# Patient Record
Sex: Female | Born: 1985
Health system: Southern US, Community
[De-identification: ages and names within clinical notes are randomized; demographics above are authoritative.]

## PROBLEM LIST (undated history)

## (undated) ENCOUNTER — Inpatient Hospital Stay (HOSPITAL_COMMUNITY): Payer: Self-pay

## (undated) DIAGNOSIS — K219 Gastro-esophageal reflux disease without esophagitis: Secondary | ICD-10-CM

## (undated) DIAGNOSIS — F419 Anxiety disorder, unspecified: Secondary | ICD-10-CM

## (undated) DIAGNOSIS — D649 Anemia, unspecified: Secondary | ICD-10-CM

## (undated) DIAGNOSIS — K279 Peptic ulcer, site unspecified, unspecified as acute or chronic, without hemorrhage or perforation: Secondary | ICD-10-CM

## (undated) HISTORY — PX: APPENDECTOMY: SHX54

## (undated) HISTORY — PX: TUBAL LIGATION: SHX77

## (undated) HISTORY — PX: TONSILLECTOMY: SUR1361

## (undated) HISTORY — PX: WISDOM TOOTH EXTRACTION: SHX21

---

## 2015-11-09 ENCOUNTER — Other Ambulatory Visit: Payer: Self-pay | Admitting: Nurse Practitioner

## 2015-11-09 DIAGNOSIS — G44221 Chronic tension-type headache, intractable: Secondary | ICD-10-CM

## 2016-07-17 DIAGNOSIS — G4721 Circadian rhythm sleep disorder, delayed sleep phase type: Secondary | ICD-10-CM | POA: Diagnosis not present

## 2016-08-30 DIAGNOSIS — M9901 Segmental and somatic dysfunction of cervical region: Secondary | ICD-10-CM | POA: Diagnosis not present

## 2016-08-30 DIAGNOSIS — M791 Myalgia: Secondary | ICD-10-CM | POA: Diagnosis not present

## 2016-08-30 DIAGNOSIS — M9902 Segmental and somatic dysfunction of thoracic region: Secondary | ICD-10-CM | POA: Diagnosis not present

## 2016-10-04 ENCOUNTER — Other Ambulatory Visit: Payer: Self-pay | Admitting: Physician Assistant

## 2016-10-04 ENCOUNTER — Ambulatory Visit
Admission: RE | Admit: 2016-10-04 | Discharge: 2016-10-04 | Disposition: A | Discharge status: Home / Self Care | Payer: 59 | Source: Ambulatory Visit | Attending: Physician Assistant | Admitting: Physician Assistant

## 2016-10-04 DIAGNOSIS — M549 Dorsalgia, unspecified: Secondary | ICD-10-CM | POA: Diagnosis not present

## 2016-10-04 DIAGNOSIS — M545 Low back pain: Secondary | ICD-10-CM

## 2016-10-04 DIAGNOSIS — M546 Pain in thoracic spine: Secondary | ICD-10-CM | POA: Diagnosis not present

## 2016-12-07 DIAGNOSIS — Z0000 Encounter for general adult medical examination without abnormal findings: Secondary | ICD-10-CM | POA: Diagnosis not present

## 2017-01-08 DIAGNOSIS — D485 Neoplasm of uncertain behavior of skin: Secondary | ICD-10-CM | POA: Diagnosis not present

## 2017-01-08 DIAGNOSIS — D225 Melanocytic nevi of trunk: Secondary | ICD-10-CM | POA: Diagnosis not present

## 2017-01-08 DIAGNOSIS — D2261 Melanocytic nevi of right upper limb, including shoulder: Secondary | ICD-10-CM | POA: Diagnosis not present

## 2017-01-08 DIAGNOSIS — L700 Acne vulgaris: Secondary | ICD-10-CM | POA: Diagnosis not present

## 2017-03-12 DIAGNOSIS — J069 Acute upper respiratory infection, unspecified: Secondary | ICD-10-CM | POA: Diagnosis not present

## 2017-06-13 DIAGNOSIS — Z01419 Encounter for gynecological examination (general) (routine) without abnormal findings: Secondary | ICD-10-CM | POA: Diagnosis not present

## 2017-07-03 DIAGNOSIS — H9202 Otalgia, left ear: Secondary | ICD-10-CM | POA: Diagnosis not present

## 2017-07-04 DIAGNOSIS — H9202 Otalgia, left ear: Secondary | ICD-10-CM | POA: Diagnosis not present

## 2017-07-04 DIAGNOSIS — M26622 Arthralgia of left temporomandibular joint: Secondary | ICD-10-CM | POA: Diagnosis not present

## 2017-07-04 DIAGNOSIS — L299 Pruritus, unspecified: Secondary | ICD-10-CM | POA: Diagnosis not present

## 2017-08-07 DIAGNOSIS — D2271 Melanocytic nevi of right lower limb, including hip: Secondary | ICD-10-CM | POA: Diagnosis not present

## 2017-08-07 DIAGNOSIS — L814 Other melanin hyperpigmentation: Secondary | ICD-10-CM | POA: Diagnosis not present

## 2017-08-07 DIAGNOSIS — Z86018 Personal history of other benign neoplasm: Secondary | ICD-10-CM | POA: Diagnosis not present

## 2017-08-07 DIAGNOSIS — D225 Melanocytic nevi of trunk: Secondary | ICD-10-CM | POA: Diagnosis not present

## 2017-08-07 DIAGNOSIS — D485 Neoplasm of uncertain behavior of skin: Secondary | ICD-10-CM | POA: Diagnosis not present

## 2017-08-07 DIAGNOSIS — D2262 Melanocytic nevi of left upper limb, including shoulder: Secondary | ICD-10-CM | POA: Diagnosis not present

## 2017-08-27 DIAGNOSIS — L2489 Irritant contact dermatitis due to other agents: Secondary | ICD-10-CM | POA: Diagnosis not present

## 2017-10-08 DIAGNOSIS — Z319 Encounter for procreative management, unspecified: Secondary | ICD-10-CM | POA: Diagnosis not present

## 2017-10-22 DIAGNOSIS — Z319 Encounter for procreative management, unspecified: Secondary | ICD-10-CM | POA: Diagnosis not present

## 2017-11-19 ENCOUNTER — Emergency Department (HOSPITAL_COMMUNITY): Payer: 59

## 2017-11-19 ENCOUNTER — Other Ambulatory Visit: Payer: Self-pay

## 2017-11-19 ENCOUNTER — Emergency Department (HOSPITAL_COMMUNITY)
Admission: EM | Admit: 2017-11-19 | Discharge: 2017-11-19 | Disposition: A | Discharge status: Home / Self Care | Payer: 59 | Attending: Emergency Medicine | Admitting: Emergency Medicine

## 2017-11-19 ENCOUNTER — Encounter (HOSPITAL_COMMUNITY): Payer: Self-pay

## 2017-11-19 DIAGNOSIS — Z3A01 Less than 8 weeks gestation of pregnancy: Secondary | ICD-10-CM | POA: Insufficient documentation

## 2017-11-19 DIAGNOSIS — O9989 Other specified diseases and conditions complicating pregnancy, childbirth and the puerperium: Secondary | ICD-10-CM | POA: Insufficient documentation

## 2017-11-19 DIAGNOSIS — Z711 Person with feared health complaint in whom no diagnosis is made: Secondary | ICD-10-CM | POA: Insufficient documentation

## 2017-11-19 DIAGNOSIS — R1030 Lower abdominal pain, unspecified: Secondary | ICD-10-CM | POA: Insufficient documentation

## 2017-11-19 DIAGNOSIS — O209 Hemorrhage in early pregnancy, unspecified: Secondary | ICD-10-CM | POA: Diagnosis not present

## 2017-11-19 DIAGNOSIS — O208 Other hemorrhage in early pregnancy: Secondary | ICD-10-CM

## 2017-11-19 DIAGNOSIS — O4690 Antepartum hemorrhage, unspecified, unspecified trimester: Secondary | ICD-10-CM

## 2017-11-19 LAB — BASIC METABOLIC PANEL
Anion gap: 7 (ref 5–15)
BUN: 7 mg/dL (ref 6–20)
CO2: 22 mmol/L (ref 22–32)
Calcium: 8.9 mg/dL (ref 8.9–10.3)
Chloride: 111 mmol/L (ref 101–111)
Creatinine, Ser: 0.74 mg/dL (ref 0.44–1.00)
GFR calc Af Amer: >60 mL/min (ref >60)
GFR calc non Af Amer: >60 mL/min (ref >60)
Glucose, Bld: 95 mg/dL (ref 65–99)
Potassium: 3.6 mmol/L (ref 3.5–5.1)
Sodium: 140 mmol/L (ref 135–145)

## 2017-11-19 LAB — CBC WITH DIFFERENTIAL/PLATELET
Abs Immature Granulocytes: 0 10*3/uL (ref 0.0–0.1)
Basophils Absolute: 0.1 10*3/uL (ref 0.0–0.1)
Basophils Relative: 1 %
Eosinophils Absolute: 0.3 10*3/uL (ref 0.0–0.7)
Eosinophils Relative: 3 %
HCT: 41.3 % (ref 36.0–46.0)
Hemoglobin: 13.9 g/dL (ref 12.0–15.0)
Immature Granulocytes: 0 %
Lymphocytes Relative: 25 %
Lymphs Abs: 2.5 10*3/uL (ref 0.7–4.0)
MCH: 30.5 pg (ref 26.0–34.0)
MCHC: 33.7 g/dL (ref 30.0–36.0)
MCV: 90.8 fL (ref 78.0–100.0)
Monocytes Absolute: 0.5 10*3/uL (ref 0.1–1.0)
Monocytes Relative: 5 %
Neutro Abs: 6.5 10*3/uL (ref 1.7–7.7)
Neutrophils Relative %: 66 %
Platelets: 243 10*3/uL (ref 150–400)
RBC: 4.55 MIL/uL (ref 3.87–5.11)
RDW: 12.2 % (ref 11.5–15.5)
WBC: 9.8 10*3/uL (ref 4.0–10.5)

## 2017-11-19 LAB — ABO/RH: ABO/RH(D): A POS

## 2017-11-19 LAB — HCG, QUANTITATIVE, PREGNANCY: hCG, Beta Chain, Quant, S: 9028 m[IU]/mL — ABNORMAL HIGH (ref <5)

## 2017-11-19 NOTE — Discharge Instructions (Addendum)
Please call your OBGYN tomorrow morning to arrange a follow up appointment. I have attached a copy of the ultrasound result report for you to bring to this appointment in case they cannot see our records. You can also let them know that your HCG today was 9,028.   Return to ER for new or worsening symptoms, any additional concerns.

## 2017-11-19 NOTE — ED Provider Notes (Signed)
[facility] EMERGENCY DEPARTMENT Provider Note   CSN: [id number] Arrival date & time: 11/19/17  1730     History   Chief Complaint Chief Complaint  Patient presents with  . Vaginal Bleeding    HPI [name] is a 32 y.o. female.  The history is provided by the patient and medical records. No language interpreter was used.   [name] is a 32 y.o. female currently 5 weelks 2 days pregnant who presents to the Emergency Department complaining of acute onset of bright red vaginal bleeding just prior to arrival. Patient reports a "gush" of bright red blood.  She felt as if the bleeding had resolved, but she just went to the restroom a few minutes ago and did have spotting.  She denies any abdominal pain or back pain currently.  She does note some mild lower abdominal cramping over the last several days, but actually did not have any today.  She has been followed by a fertility clinic and has been seen on Monday and Wednesday of last week for hCG checks, but has not had an ultrasound yet.  Most recent hCG was about 5 or 6 days ago and was 586.   History reviewed. No pertinent past medical history.  There are no active problems to display for this patient.   Past Surgical History:  Procedure Laterality Date  . TONSILLECTOMY       OB History    Gravida  1   Para      Term      Preterm      AB      Living        SAB      TAB      Ectopic      Multiple      Live Births               Home Medications    Prior to Admission medications   Not on File    Family History No family history on file.  Social History Social History   Tobacco Use  . Smoking status: Never Smoker  . Smokeless tobacco: Never Used  Substance Use Topics  . Alcohol use: Not Currently  . Drug use: Not Currently     Allergies   Patient has no known allergies.   Review of Systems Review of Systems  Gastrointestinal: Positive for abdominal pain  (Resolved). Negative for nausea and vomiting.  Genitourinary: Positive for vaginal bleeding.  All other systems reviewed and are negative.    Physical Exam Updated Vital Signs BP 112/62   Pulse 96   Temp 98.3 F (36.8 C) (Oral)   Resp 18   Ht 5\' 7"  (1.702 m)   Wt 79.4 kg (175 lb)   LMP 10/13/2017   SpO2 100%   BMI 27.41 kg/m   Physical Exam  Constitutional: She is oriented to person, place, and time. She appears well-developed and well-nourished. No distress.  HENT:  Head: Normocephalic and atraumatic.  Cardiovascular: Normal rate, regular rhythm and normal heart sounds.  No murmur heard. Pulmonary/Chest: Effort normal and breath sounds normal. No respiratory distress.  Abdominal: Soft. She exhibits no distension.  No abdominal tenderness.   Genitourinary:  Genitourinary Comments: Chaperone present for exam. Very small clot within vaginal vault.  Neurological: She is alert and oriented to person, place, and time.  Skin: Skin is warm and dry.  Nursing note and vitals reviewed.    ED Treatments / Results  Labs (  all labs ordered are listed, but only abnormal results are displayed) Labs Reviewed  HCG, QUANTITATIVE, PREGNANCY - Abnormal; Notable for the following components:      Result Value   hCG, Beta Chain, Quant, S 9,028 (*)    All other components within normal limits  CBC WITH DIFFERENTIAL/PLATELET  BASIC METABOLIC PANEL  ABO/RH    EKG None  Radiology US Ob Less Than 14 Weeks With Ob Transvaginal  Result Date: 11/19/2017 CLINICAL DATA:  Vaginal bleeding EXAM: OBSTETRIC <14 WK [place] AND TRANSVAGINAL OB US TECHNIQUE: Both transabdominal and transvaginal ultrasound examinations were performed for complete evaluation of the gestation as well as the maternal uterus, adnexal regions, and pelvic cul-de-sac. Transvaginal technique was performed to assess early pregnancy. COMPARISON:  None. FINDINGS: Intrauterine gestational sac: Visualized Yolk sac:  Not visualized  Embryo:  Not visualized Cardiac Activity: Not visualized MSD: 7 mm   5 w   3 d Subchorionic hemorrhage:  None visualized. Maternal uterus/adnexae: Uterus measures 9.9 x 4.2 x 5.2 cm. No intrauterine mass. Cervical os is closed. Right ovary measures 2.4 x 1.9 x 2.5 cm. Left ovary measures 3.8 x 2.4 x 2.8 cm. Corpus luteum on the left measures 2.7 x 1.8 x 2.0 cm. No free pelvic fluid. IMPRESSION: Probable early intrauterine gestational sac, but no yolk sac, fetal pole, or cardiac activity yet visualized. Recommend follow-up quantitative B-HCG levels and follow-up US in 14 days to assess viability. This recommendation follows SRU consensus guidelines: Diagnostic Criteria for Nonviable Pregnancy Early in the First Trimester. [employer] Med 2013; 431:5400-86. based on gestational sac size, estimated gestational age is 5+ weeks. Study otherwise unremarkable. Electronically Signed   By: Lowella Grip III M.D.   On: 11/19/2017 20:51    Procedures Procedures (including critical care time)  Medications Ordered in ED Medications - No data to display   Initial Impression / Assessment and Plan / ED Course  I have reviewed the triage vital signs and the nursing notes.  Pertinent labs & imaging results that were available during my care of the patient were reviewed by me and considered in my medical decision making (see chart for details).    [name] is a 32 y.o. female who presents to ED for vaginal bleeding which began acutely today. [redacted] weeks pregnant. Most recent hcg in office per patient was 586. Today is 9028. She did have small clot on pelvic exam. A+ - does not need rhogam. Ultrasound obtained showing probable early intrauterine gestational sac, but no yolk sac, fetal pole or cardiac activity.  No subchorionic hemorrhage noted.  Given history and ultrasound findings, miscarriage most likely.  Discussed this with patient.  Discussed importance of follow-up with her OB/GYN for repeat hCG and ultrasound.   She understands and agrees with the plan as dictated above.  We also discussed reasons to return to the emergency department.  All questions were answered.   Final Clinical Impressions(s) / ED Diagnoses   Final diagnoses:  Vaginal bleeding in pregnancy  Concern about miscarriage without diagnosis    ED Discharge Orders    None       [name], Ozella Almond, PA-C 11/19/17 2214    Duffy Bruce, MD 11/20/17 1022

## 2017-11-19 NOTE — ED Provider Notes (Signed)
Patient placed in Quick Look pathway, seen and evaluated   Chief Complaint: Vaginal bleeding  HPI: 32-year-old female presents today with complaints of vaginal bleeding.  Patient notes a gush of vaginal blood today, no active bleeding presently.  She notes she has had some minor bilateral lower abdominal cramping.  She notes she is approximately [redacted] weeks along.  Patient has not had an ultrasound.   ROS: Vaginal bleeding (one)  Physical Exam:   Gen: No distress  Neuro: Awake and Alert  Skin: Warm    Focused Exam:    Initiation of care has begun. The patient has been counseled on the process, plan, and necessity for staying for the completion/evaluation, and the remainder of the medical screening examination   Francee Gentile 11/19/17 1757    Noemi Chapel, MD 11/23/17 [phone]

## 2017-11-19 NOTE — ED Triage Notes (Signed)
Pt states that she is 5 weeks and 2 days pregnant, closely followed by fertility specialist. States that today at 5:05 pm she had a large gush of blood that has since stopped. Pt reports some mild cramping for the past week.

## 2017-11-19 NOTE — ED Notes (Signed)
Pt transported to Ultrasound.  

## 2017-11-28 DIAGNOSIS — Z3200 Encounter for pregnancy test, result unknown: Secondary | ICD-10-CM | POA: Diagnosis not present

## 2017-12-12 DIAGNOSIS — O0900 Supervision of pregnancy with history of infertility, unspecified trimester: Secondary | ICD-10-CM | POA: Diagnosis not present

## 2017-12-26 DIAGNOSIS — O26849 Uterine size-date discrepancy, unspecified trimester: Secondary | ICD-10-CM | POA: Diagnosis not present

## 2017-12-27 ENCOUNTER — Encounter (HOSPITAL_COMMUNITY): Payer: Self-pay | Admitting: *Deleted

## 2017-12-27 ENCOUNTER — Inpatient Hospital Stay (HOSPITAL_COMMUNITY)
Admission: AD | Admit: 2017-12-27 | Discharge: 2017-12-27 | Disposition: A | Discharge status: Home / Self Care | Payer: 59 | Source: Ambulatory Visit | Attending: Obstetrics and Gynecology | Admitting: Obstetrics and Gynecology

## 2017-12-27 DIAGNOSIS — O99611 Diseases of the digestive system complicating pregnancy, first trimester: Secondary | ICD-10-CM | POA: Insufficient documentation

## 2017-12-27 DIAGNOSIS — Z3A10 10 weeks gestation of pregnancy: Secondary | ICD-10-CM | POA: Insufficient documentation

## 2017-12-27 DIAGNOSIS — O219 Vomiting of pregnancy, unspecified: Secondary | ICD-10-CM

## 2017-12-27 DIAGNOSIS — Z79899 Other long term (current) drug therapy: Secondary | ICD-10-CM | POA: Diagnosis not present

## 2017-12-27 DIAGNOSIS — O99281 Endocrine, nutritional and metabolic diseases complicating pregnancy, first trimester: Secondary | ICD-10-CM

## 2017-12-27 DIAGNOSIS — Z9889 Other specified postprocedural states: Secondary | ICD-10-CM | POA: Diagnosis not present

## 2017-12-27 DIAGNOSIS — Z9089 Acquired absence of other organs: Secondary | ICD-10-CM | POA: Insufficient documentation

## 2017-12-27 DIAGNOSIS — E639 Nutritional deficiency, unspecified: Secondary | ICD-10-CM

## 2017-12-27 HISTORY — DX: Gastro-esophageal reflux disease without esophagitis: K21.9

## 2017-12-27 LAB — CBC
HCT: 40.9 % (ref 36.0–46.0)
Hemoglobin: 14.1 g/dL (ref 12.0–15.0)
MCH: 31.3 pg (ref 26.0–34.0)
MCHC: 34.5 g/dL (ref 30.0–36.0)
MCV: 90.7 fL (ref 78.0–100.0)
PLATELETS: 209 10*3/uL (ref 150–400)
RBC: 4.51 MIL/uL (ref 3.87–5.11)
RDW: 12.4 % (ref 11.5–15.5)
WBC: 10.7 10*3/uL — ABNORMAL HIGH (ref 4.0–10.5)

## 2017-12-27 LAB — URINALYSIS, ROUTINE W REFLEX MICROSCOPIC
Bilirubin Urine: NEGATIVE
GLUCOSE, UA: NEGATIVE mg/dL
KETONES UR: 80 mg/dL — AB
Leukocytes, UA: NEGATIVE
NITRITE: NEGATIVE
Protein, ur: NEGATIVE mg/dL
Specific Gravity, Urine: 1.02 (ref 1.005–1.030)
pH: 5 (ref 5.0–8.0)

## 2017-12-27 LAB — COMPREHENSIVE METABOLIC PANEL
ALT: 12 U/L (ref 0–44)
ANION GAP: 12 (ref 5–15)
AST: 36 U/L (ref 15–41)
Albumin: 3.6 g/dL (ref 3.5–5.0)
Alkaline Phosphatase: 56 U/L (ref 38–126)
BUN: 11 mg/dL (ref 6–20)
CHLORIDE: 104 mmol/L (ref 98–111)
CO2: 19 mmol/L — AB (ref 22–32)
Calcium: 9.1 mg/dL (ref 8.9–10.3)
Creatinine, Ser: 0.52 mg/dL (ref 0.44–1.00)
GFR calc Af Amer: >60 mL/min (ref >60)
GFR calc non Af Amer: >60 mL/min (ref >60)
Glucose, Bld: 80 mg/dL (ref 70–99)
POTASSIUM: 5.3 mmol/L — AB (ref 3.5–5.1)
SODIUM: 135 mmol/L (ref 135–145)
Total Bilirubin: 1.5 mg/dL — ABNORMAL HIGH (ref 0.3–1.2)
Total Protein: 7.3 g/dL (ref 6.5–8.1)

## 2017-12-27 MED ORDER — PROMETHAZINE HCL 25 MG/ML IJ SOLN
25.0000 mg | Freq: Once | INTRAVENOUS | Status: DC
  Filled 2017-12-27: qty 1

## 2017-12-27 MED ORDER — METOCLOPRAMIDE HCL 10 MG PO TABS: 10.0000 mg | ORAL_TABLET | Freq: Four times a day (QID) | ORAL | 0 refills | Status: DC

## 2017-12-27 MED ORDER — ONDANSETRON 4 MG PO TBDP
4.0000 mg | ORAL_TABLET | Freq: Once | ORAL | Status: AC
  Administered 2017-12-27: 4 mg via ORAL
  Filled 2017-12-27: qty 1

## 2017-12-27 MED ORDER — M.V.I. ADULT IV INJ
Freq: Once | INTRAVENOUS | Status: AC
  Administered 2017-12-27: 15:00:00 via INTRAVENOUS
  Filled 2017-12-27: qty 10

## 2017-12-27 MED ORDER — PROMETHAZINE HCL 25 MG/ML IJ SOLN
12.5000 mg | Freq: Once | INTRAMUSCULAR | Status: AC
  Administered 2017-12-27: 12.5 mg via INTRAVENOUS
  Filled 2017-12-27: qty 1

## 2017-12-27 MED ORDER — LACTATED RINGERS IV SOLN
INTRAVENOUS | Status: DC
  Administered 2017-12-27: 16:00:00 via INTRAVENOUS

## 2017-12-27 NOTE — Discharge Instructions (Signed)

## 2017-12-27 NOTE — MAU Provider Note (Signed)
History     CSN: [id number]  Arrival date and time: 12/27/17 1231   First Provider Initiated Contact with Patient 12/27/17 1336      No chief complaint on file.  HPI [name] is a G1P0 at [redacted]w[redacted]d who resents to MAU with chief complaint of unrelieved nausea and vomiting, unable to tolerate PO intake whatsoever.  Reports nausea has been a mild issue in her pregnancy that worsened significantly starting yesterday at lunchtime. She believes she has vomited more than ten times since lunch time yesterday.  Lying in right lateral on MAU stretcher, unable to speak in full sentences due to nausea.Denies vaginal bleeding, leaking of fluid, decreased fetal movement, fever, falls, or recent illness.     OB History    Gravida  1   Para      Term      Preterm      AB      Living        SAB      TAB      Ectopic      Multiple      Live Births              Past Medical History:  Diagnosis Date  . GERD (gastroesophageal reflux disease)     Past Surgical History:  Procedure Laterality Date  . TONSILLECTOMY    . WISDOM TOOTH EXTRACTION      History reviewed. No pertinent family history.  Social History   Tobacco Use  . Smoking status: Never Smoker  . Smokeless tobacco: Never Used  Substance Use Topics  . Alcohol use: Not Currently    Comment: prior to preg  . Drug use: Never    Allergies: No Known Allergies  Medications Prior to Admission  Medication Sig Dispense Refill Last Dose  . doxylamine, Sleep, (UNISOM) 25 MG tablet Take 25 mg by mouth at bedtime as needed.   Past Week at Unknown time  . prenatal vitamin w/FE, FA (PRENATAL 1 + 1) 27-1 MG TABS tablet Take 1 tablet by mouth daily at 12 noon.   Past Week at Unknown time  . pyridOXINE (VITAMIN B-6) 100 MG tablet Take 100 mg by mouth daily.   12/27/2017 at Unknown time    Review of Systems  Constitutional: Positive for fatigue. Negative for fever.  Gastrointestinal: Positive for abdominal pain.        Patient states her stomach feels sore from multiple vomiting episodes  Genitourinary: Negative for difficulty urinating, dysuria, pelvic pain, vaginal bleeding, vaginal discharge and vaginal pain.  Neurological: Positive for dizziness, light-headedness and headaches. Negative for syncope and weakness.   Physical Exam   Blood pressure (!) 99/56, pulse (!) 123, temperature 98.3 F (36.8 C), temperature source Oral, resp. rate 16, height 5\' 7"  (1.702 m), last menstrual period 10/13/2017.  Physical Exam  Nursing note and vitals reviewed. Constitutional: She is oriented to person, place, and time. She appears well-developed and well-nourished.  Cardiovascular: Normal rate, regular rhythm, normal heart sounds and intact distal pulses.  Respiratory: Effort normal and breath sounds normal.  GI: Soft. Bowel sounds are normal.  Musculoskeletal: Normal range of motion.  Neurological: She is alert and oriented to person, place, and time. She has normal reflexes.  Skin: Skin is warm and dry.  Psychiatric: She has a normal mood and affect. Her behavior is normal. Judgment and thought content normal.    MAU Course  Procedures  MDM New OB and viability scan yesterday at [facility]  OB/GYN Nausea/vomiting in early pregnancy Responding well to IV vitamins/D5LR No vomiting episodes in MAU Reviewed Zofran vs. Reglan vs Phenergan for outpatient management Patient and FOB open to discussion about meal planning to minimize nausea/vomiting episodes  Patient Vitals for the past 24 hrs:  BP Temp Temp src Pulse Resp SpO2 Height  12/27/17 1700 108/61 98.6 F (37 C) Oral 74 - 100 % -  12/27/17 1330 (!) 99/56 - - (!) 123 - - -  12/27/17 1327 127/68 - - 87 - - -  12/27/17 1326 108/62 98.3 F (36.8 C) Oral 91 16 - 5\' 7"  (1.702 m)     Results for orders placed or performed during the hospital encounter of 12/27/17 (from the past 24 hour(s))  Urinalysis, Routine w reflex microscopic     Status: Abnormal    Collection Time: 12/27/17  1:05 PM  Result Value Ref Range   Color, Urine YELLOW YELLOW   APPearance HAZY (A) CLEAR   Specific Gravity, Urine 1.020 1.005 - 1.030   pH 5.0 5.0 - 8.0   Glucose, UA NEGATIVE NEGATIVE mg/dL   Hgb urine dipstick MODERATE (A) NEGATIVE   Bilirubin Urine NEGATIVE NEGATIVE   Ketones, ur 80 (A) NEGATIVE mg/dL   Protein, ur NEGATIVE NEGATIVE mg/dL   Nitrite NEGATIVE NEGATIVE   Leukocytes, UA NEGATIVE NEGATIVE   RBC / HPF 0-5 0 - 5 RBC/hpf   WBC, UA 0-5 0 - 5 WBC/hpf   Bacteria, UA RARE (A) NONE SEEN   Squamous Epithelial / LPF 0-5 0 - 5   Mucus PRESENT   CBC     Status: Abnormal   Collection Time: 12/27/17  2:32 PM  Result Value Ref Range   WBC 10.7 (H) 4.0 - 10.5 K/uL   RBC 4.51 3.87 - 5.11 MIL/uL   Hemoglobin 14.1 12.0 - 15.0 g/dL   HCT 40.9 36.0 - 46.0 %   MCV 90.7 78.0 - 100.0 fL   MCH 31.3 26.0 - 34.0 pg   MCHC 34.5 30.0 - 36.0 g/dL   RDW 12.4 11.5 - 15.5 %   Platelets 209 150 - 400 K/uL  Comprehensive metabolic panel     Status: Abnormal   Collection Time: 12/27/17  2:32 PM  Result Value Ref Range   Sodium 135 135 - 145 mmol/L   Potassium 5.3 (H) 3.5 - 5.1 mmol/L   Chloride 104 98 - 111 mmol/L   CO2 19 (L) 22 - 32 mmol/L   Glucose, Bld 80 70 - 99 mg/dL   BUN 11 6 - 20 mg/dL   Creatinine, Ser 0.52 0.44 - 1.00 mg/dL   Calcium 9.1 8.9 - 10.3 mg/dL   Total Protein 7.3 6.5 - 8.1 g/dL   Albumin 3.6 3.5 - 5.0 g/dL   AST 36 15 - 41 U/L   ALT 12 0 - 44 U/L   Alkaline Phosphatase 56 38 - 126 U/L   Total Bilirubin 1.5 (H) 0.3 - 1.2 mg/dL   GFR calc non Af Amer >60 >60 mL/min   GFR calc Af Amer >60 >60 mL/min   Anion gap 12 5 - 15    Meds ordered this encounter  Medications  . ondansetron (ZOFRAN-ODT) disintegrating tablet 4 mg  . multivitamins adult (MVI -12) 10 mL in dextrose 5% lactated ringers 1,000 mL infusion  . lactated ringers infusion  . DISCONTD: promethazine (PHENERGAN) 25 mg in lactated ringers 1,000 mL infusion  . promethazine  (PHENERGAN) injection 12.5 mg  . metoCLOPramide (REGLAN) 10 MG tablet  Sig: Take 1 tablet (10 mg total) by mouth every 6 (six) hours.    Dispense:  30 tablet    Refill:  0    Order Specific Question:   Supervising Provider    Answer:   Donnamae Jude [[id number]]   Discussed with the patient the risk of Zofran use in the first trimester of pregnancy. Risks of use in the first trimester include birth defects in babies, specifically cleft lip/palate.  Pt states understanding and plans to use Zofran sparingly.    Feeling much better after IV fluids and Zofran ODT  Assessment and Plan  --Nausea and vomiting in pregnancy --Desires antiemetic alternative to Doxylamine/B6 --Advised Reglan 10 mg PO am and Benadryl or Tylenol PM at bedtime --Reviewed bland diet and advised to journal trigger foods/smells to minimize episodes --Patient to follow up outpatient with [facility] Provider  Presentation, clinical findings, and plan discussed with Dr. Rogue Bussing. Stable for discharge home.  Darlina Rumpf, CNM 12/27/2017, 5:25 PM

## 2017-12-27 NOTE — MAU Note (Signed)
Pt had first appt at [facility] yesterday. Started vomiting after the appt and vomited at least 6 x yesterday and 5X today. Unable to keep crackers or sips of pedialtye down. Denies any diarrhea.

## 2018-01-13 DIAGNOSIS — Z3480 Encounter for supervision of other normal pregnancy, unspecified trimester: Secondary | ICD-10-CM | POA: Diagnosis not present

## 2018-01-13 LAB — OB RESULTS CONSOLE HEPATITIS B SURFACE ANTIGEN: HEP B S AG: NEGATIVE

## 2018-01-13 LAB — OB RESULTS CONSOLE ABO/RH: RH TYPE: POSITIVE

## 2018-01-13 LAB — OB RESULTS CONSOLE HIV ANTIBODY (ROUTINE TESTING): HIV: NONREACTIVE

## 2018-01-13 LAB — OB RESULTS CONSOLE GC/CHLAMYDIA
CHLAMYDIA, DNA PROBE: NEGATIVE
Gonorrhea: NEGATIVE

## 2018-01-13 LAB — OB RESULTS CONSOLE RUBELLA ANTIBODY, IGM: Rubella: NON-IMMUNE/NOT IMMUNE

## 2018-01-13 LAB — OB RESULTS CONSOLE ANTIBODY SCREEN: Antibody Screen: NEGATIVE

## 2018-01-13 LAB — OB RESULTS CONSOLE RPR: RPR: NONREACTIVE

## 2018-02-20 DIAGNOSIS — Z369 Encounter for antenatal screening, unspecified: Secondary | ICD-10-CM | POA: Diagnosis not present

## 2018-02-27 DIAGNOSIS — L910 Hypertrophic scar: Secondary | ICD-10-CM | POA: Diagnosis not present

## 2018-03-04 DIAGNOSIS — M542 Cervicalgia: Secondary | ICD-10-CM | POA: Diagnosis not present

## 2018-03-04 DIAGNOSIS — M2560 Stiffness of unspecified joint, not elsewhere classified: Secondary | ICD-10-CM | POA: Diagnosis not present

## 2018-03-04 DIAGNOSIS — R293 Abnormal posture: Secondary | ICD-10-CM | POA: Diagnosis not present

## 2018-04-18 DIAGNOSIS — Z3480 Encounter for supervision of other normal pregnancy, unspecified trimester: Secondary | ICD-10-CM | POA: Diagnosis not present

## 2018-04-22 IMAGING — CR DG LUMBAR SPINE 2-3V
3 series · 3 of 3 positions shown · non-contrast
Comparison: None in PACs

CLINICAL DATA: Chronic low back pain with a catching sensation for
several months. No known injury

EXAM:
LUMBAR SPINE - 2-3 VIEW

[w lumbar spine ap]
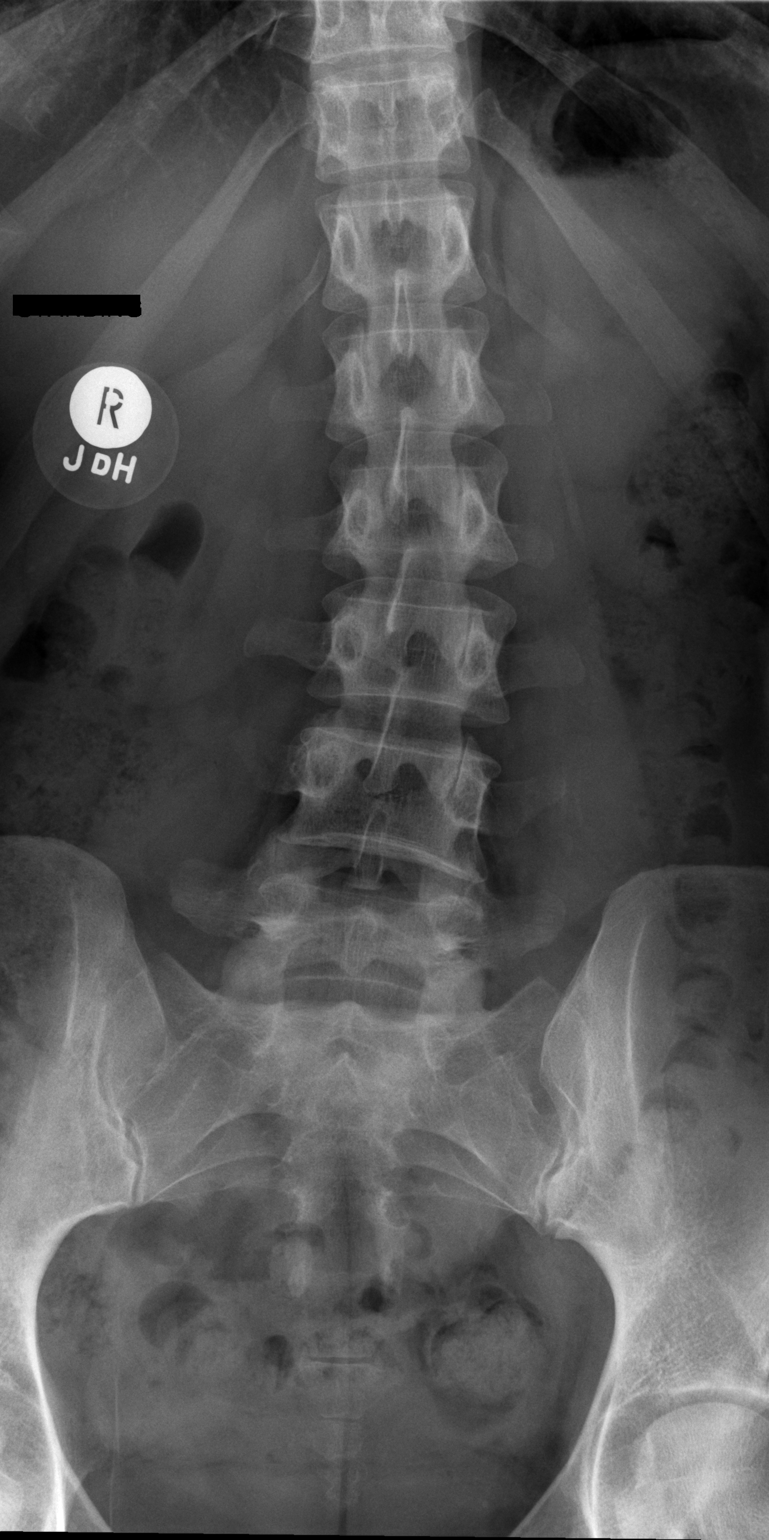

[w lumbar spine lat]
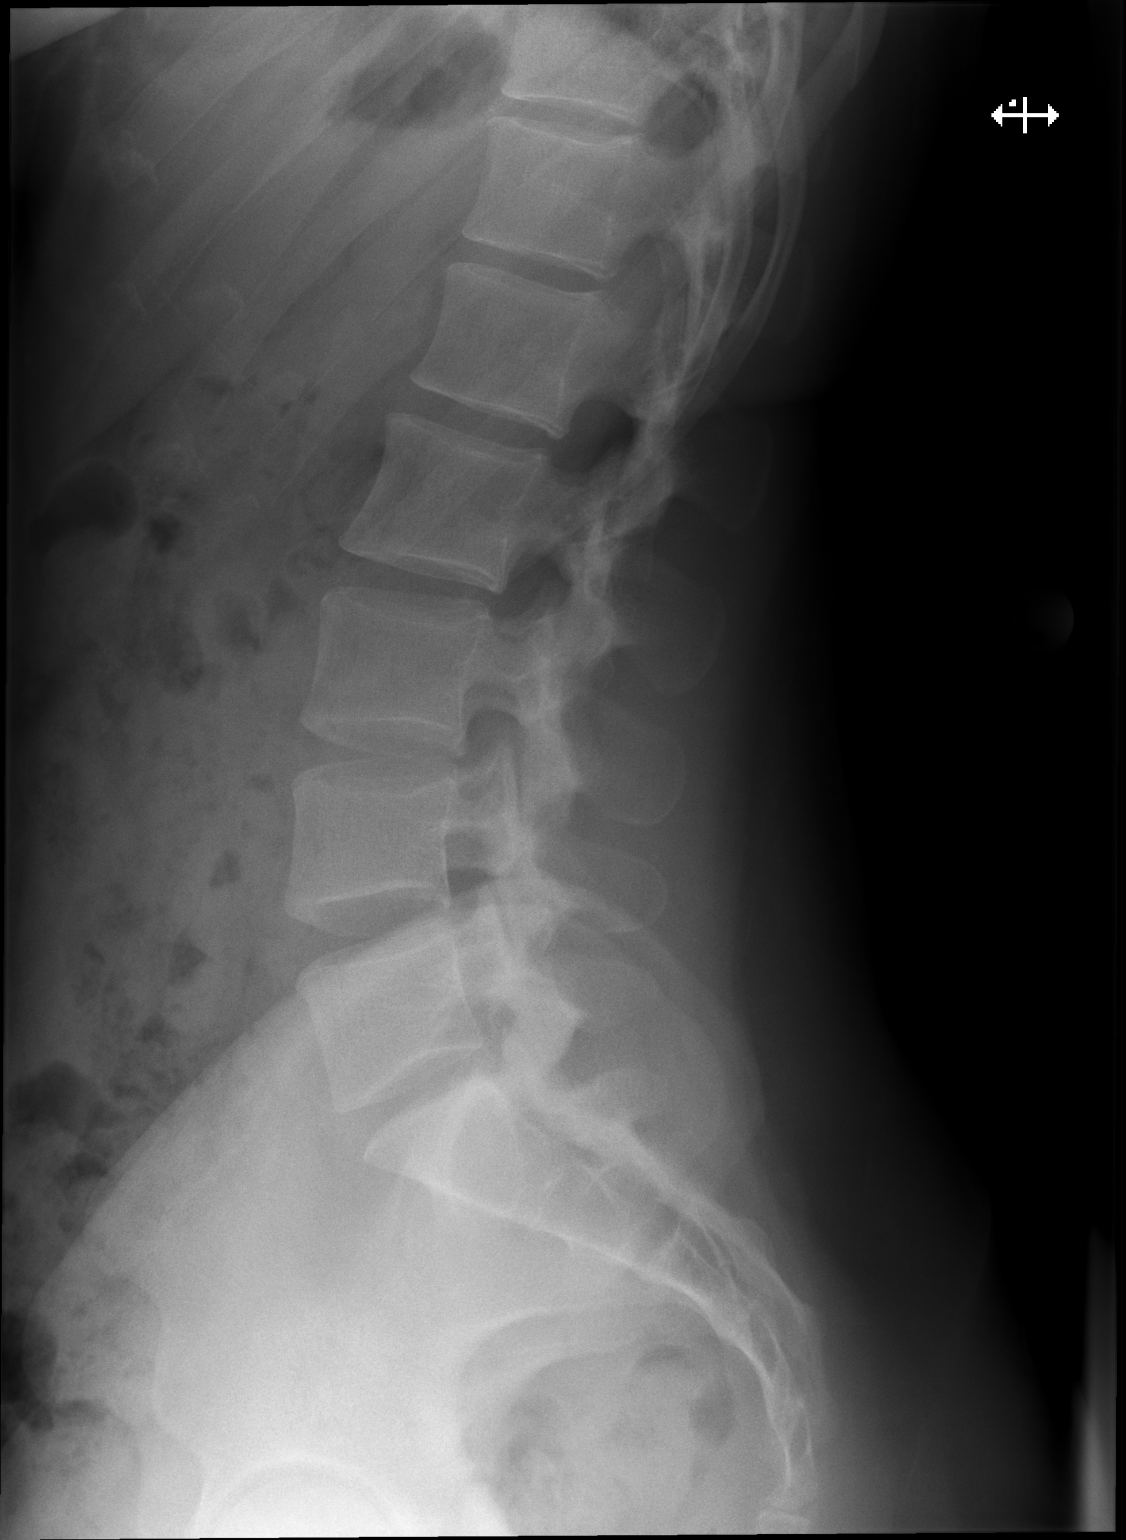

[w lumbar l-5 s-1 spot]
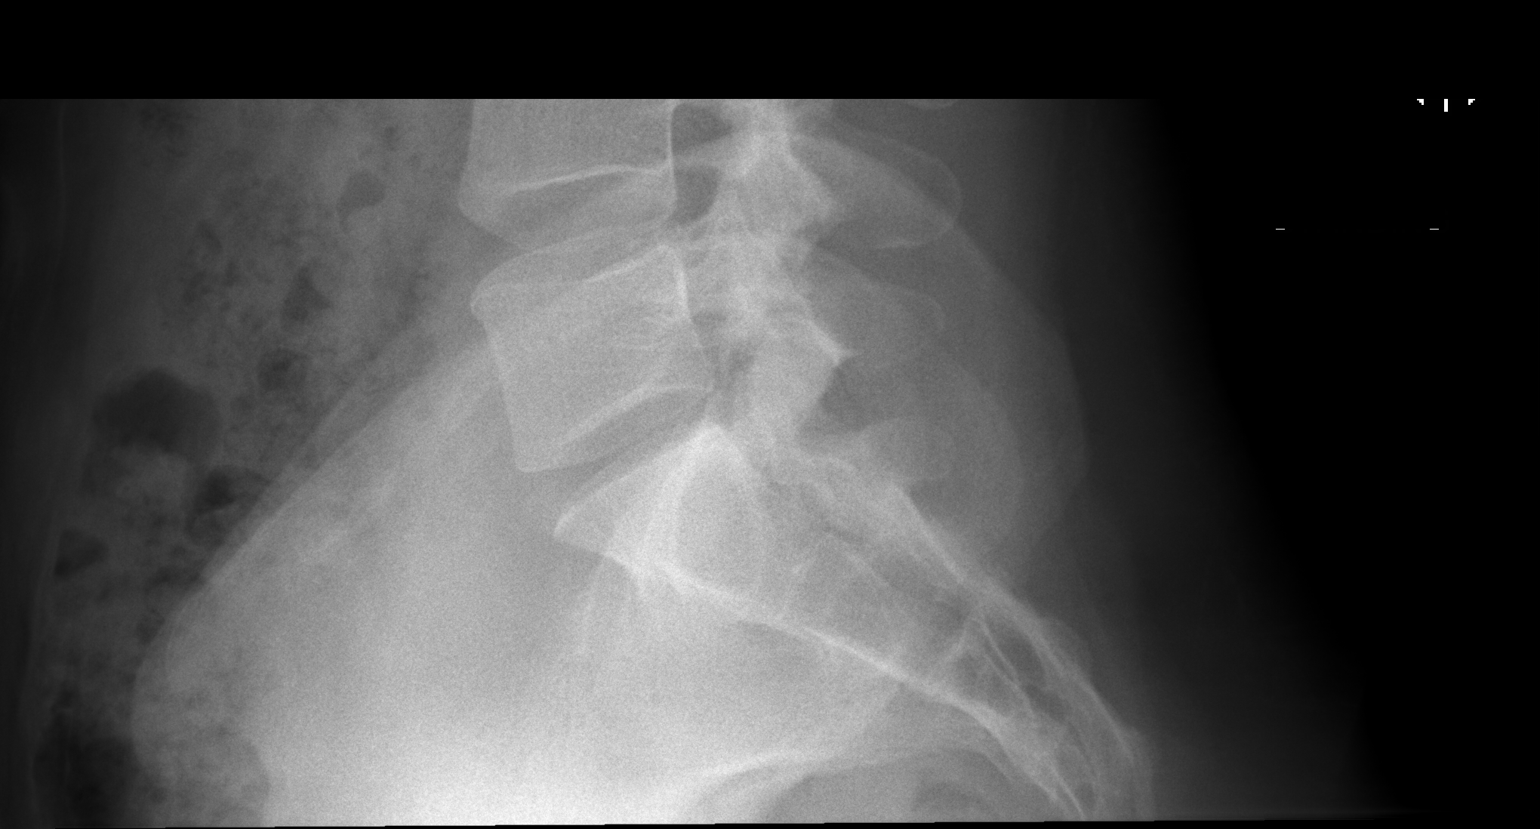

[3 of 3 positions shown; findings below may reference images not displayed]

FINDINGS: There is mild levocurvature centered at L2-3. The pedicles and
transverse processes are intact. The vertebral bodies are preserved
in height. There is no spondylolisthesis. The disc space heights are
well maintained. The observed portions of the sacrum are normal.
IMPRESSION: Gentle levocurvature centered at L2-3. No compression fracture,
significant disc space narrowing, or spondylolisthesis.

## 2018-05-01 DIAGNOSIS — O99810 Abnormal glucose complicating pregnancy: Secondary | ICD-10-CM | POA: Diagnosis not present

## 2018-05-13 DIAGNOSIS — Q270 Congenital absence and hypoplasia of umbilical artery: Secondary | ICD-10-CM | POA: Diagnosis not present

## 2018-05-13 DIAGNOSIS — Z23 Encounter for immunization: Secondary | ICD-10-CM | POA: Diagnosis not present

## 2018-06-09 ENCOUNTER — Inpatient Hospital Stay (HOSPITAL_COMMUNITY)
Admission: AD | Admit: 2018-06-09 | Discharge: 2018-06-09 | Disposition: A | Discharge status: Home / Self Care | Payer: 59 | Attending: Obstetrics and Gynecology | Admitting: Obstetrics and Gynecology

## 2018-06-09 ENCOUNTER — Inpatient Hospital Stay (HOSPITAL_BASED_OUTPATIENT_CLINIC_OR_DEPARTMENT_OTHER): Payer: 59

## 2018-06-09 ENCOUNTER — Encounter (HOSPITAL_COMMUNITY): Payer: Self-pay

## 2018-06-09 DIAGNOSIS — Z3A34 34 weeks gestation of pregnancy: Secondary | ICD-10-CM | POA: Insufficient documentation

## 2018-06-09 DIAGNOSIS — O9A213 Injury, poisoning and certain other consequences of external causes complicating pregnancy, third trimester: Secondary | ICD-10-CM

## 2018-06-09 DIAGNOSIS — O26893 Other specified pregnancy related conditions, third trimester: Secondary | ICD-10-CM | POA: Insufficient documentation

## 2018-06-09 DIAGNOSIS — R1030 Lower abdominal pain, unspecified: Secondary | ICD-10-CM | POA: Insufficient documentation

## 2018-06-09 LAB — URINALYSIS, ROUTINE W REFLEX MICROSCOPIC
Bilirubin Urine: NEGATIVE
GLUCOSE, UA: NEGATIVE mg/dL
Hgb urine dipstick: NEGATIVE
Ketones, ur: NEGATIVE mg/dL
Leukocytes, UA: NEGATIVE
NITRITE: NEGATIVE
PROTEIN: NEGATIVE mg/dL
Specific Gravity, Urine: 1.003 — ABNORMAL LOW (ref 1.005–1.030)
pH: 7 (ref 5.0–8.0)

## 2018-06-09 MED ORDER — LACTATED RINGERS IV SOLN: INTRAVENOUS | Status: DC
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
## 2018-06-23 DIAGNOSIS — Z3480 Encounter for supervision of other normal pregnancy, unspecified trimester: Secondary | ICD-10-CM | POA: Diagnosis not present

## 2018-06-23 DIAGNOSIS — Q270 Congenital absence and hypoplasia of umbilical artery: Secondary | ICD-10-CM | POA: Diagnosis not present

## 2018-06-26 LAB — OB RESULTS CONSOLE GBS: STREP GROUP B AG: NEGATIVE

## 2018-07-08 DIAGNOSIS — O320XX9 Maternal care for unstable lie, other fetus: Secondary | ICD-10-CM | POA: Diagnosis not present

## 2018-07-15 ENCOUNTER — Other Ambulatory Visit: Payer: Self-pay | Admitting: Obstetrics and Gynecology

## 2018-07-15 DIAGNOSIS — O368131 Decreased fetal movements, third trimester, fetus 1: Secondary | ICD-10-CM | POA: Diagnosis not present

## 2018-07-16 ENCOUNTER — Telehealth (HOSPITAL_COMMUNITY): Payer: Self-pay | Admitting: *Deleted

## 2018-07-16 NOTE — Telephone Encounter (Signed)
Preadmission screen  

## 2018-07-17 ENCOUNTER — Telehealth (HOSPITAL_COMMUNITY): Payer: Self-pay | Admitting: *Deleted

## 2018-07-17 NOTE — Telephone Encounter (Signed)
Preadmission screen  

## 2018-07-21 DIAGNOSIS — O480 Post-term pregnancy: Secondary | ICD-10-CM | POA: Diagnosis not present

## 2018-07-23 ENCOUNTER — Telehealth (HOSPITAL_COMMUNITY): Payer: Self-pay | Admitting: *Deleted

## 2018-07-23 ENCOUNTER — Encounter (HOSPITAL_COMMUNITY): Payer: Self-pay | Admitting: *Deleted

## 2018-07-23 NOTE — Telephone Encounter (Signed)
Preadmission screen  

## 2018-07-25 ENCOUNTER — Inpatient Hospital Stay (HOSPITAL_COMMUNITY): Payer: 59 | Admitting: Anesthesiology

## 2018-07-25 ENCOUNTER — Encounter (HOSPITAL_COMMUNITY): Payer: Self-pay

## 2018-07-25 ENCOUNTER — Inpatient Hospital Stay (HOSPITAL_COMMUNITY)
Admission: RE | Admit: 2018-07-25 | Discharge: 2018-07-28 | DRG: 788 | Disposition: A | Discharge status: Home / Self Care | Payer: 59 | Attending: Obstetrics and Gynecology | Admitting: Obstetrics and Gynecology

## 2018-07-25 ENCOUNTER — Encounter (HOSPITAL_COMMUNITY)
Admission: RE | Disposition: A | Discharge status: Home / Self Care | Payer: Self-pay | Source: Home / Self Care | Attending: Obstetrics and Gynecology

## 2018-07-25 ENCOUNTER — Other Ambulatory Visit: Payer: Self-pay

## 2018-07-25 VITALS — BP 127/84 | HR 73 | Temp 97.6°F | Resp 16 | Ht 67.0 in | Wt 219.0 lb

## 2018-07-25 DIAGNOSIS — O9902 Anemia complicating childbirth: Secondary | ICD-10-CM | POA: Diagnosis present

## 2018-07-25 DIAGNOSIS — D649 Anemia, unspecified: Secondary | ICD-10-CM | POA: Diagnosis present

## 2018-07-25 DIAGNOSIS — Z3A00 Weeks of gestation of pregnancy not specified: Secondary | ICD-10-CM | POA: Diagnosis not present

## 2018-07-25 DIAGNOSIS — Z3490 Encounter for supervision of normal pregnancy, unspecified, unspecified trimester: Secondary | ICD-10-CM

## 2018-07-25 DIAGNOSIS — O480 Post-term pregnancy: Secondary | ICD-10-CM | POA: Diagnosis not present

## 2018-07-25 DIAGNOSIS — Z3A40 40 weeks gestation of pregnancy: Secondary | ICD-10-CM | POA: Diagnosis not present

## 2018-07-25 DIAGNOSIS — Z3A41 41 weeks gestation of pregnancy: Secondary | ICD-10-CM

## 2018-07-25 HISTORY — DX: Encounter for supervision of normal pregnancy, unspecified, unspecified trimester: Z34.90

## 2018-07-25 LAB — CBC
HEMATOCRIT: 31.2 % — AB (ref 36.0–46.0)
Hemoglobin: 10 g/dL — ABNORMAL LOW (ref 12.0–15.0)
MCH: 27 pg (ref 26.0–34.0)
MCHC: 32.1 g/dL (ref 30.0–36.0)
MCV: 84.3 fL (ref 80.0–100.0)
Platelets: 144 10*3/uL — ABNORMAL LOW (ref 150–400)
RBC: 3.7 MIL/uL — ABNORMAL LOW (ref 3.87–5.11)
RDW: 15.9 % — ABNORMAL HIGH (ref 11.5–15.5)
WBC: 8.9 10*3/uL (ref 4.0–10.5)

## 2018-07-25 LAB — TYPE AND SCREEN
ABO/RH(D): A POS
Antibody Screen: NEGATIVE

## 2018-07-25 LAB — RPR: RPR Ser Ql: NONREACTIVE

## 2018-07-25 LAB — ABO/RH: ABO/RH(D): A POS

## 2018-07-25 SURGERY — Surgical Case
Anesthesia: Epidural

## 2018-07-25 MED ORDER — PHENYLEPHRINE HCL 10 MG/ML IJ SOLN
INTRAMUSCULAR | Status: DC | PRN
  Administered 2018-07-25 (×3): 80 ug via INTRAVENOUS

## 2018-07-25 MED ORDER — TERBUTALINE SULFATE 1 MG/ML IJ SOLN
0.2500 mg | Freq: Once | INTRAMUSCULAR | Status: AC | PRN
  Administered 2018-07-25: 0.25 mg via SUBCUTANEOUS
  Filled 2018-07-25: qty 1

## 2018-07-25 MED ORDER — DIPHENHYDRAMINE HCL 25 MG PO CAPS: 25.0000 mg | ORAL_CAPSULE | Freq: Four times a day (QID) | ORAL | Status: DC | PRN

## 2018-07-25 MED ORDER — DEXAMETHASONE SODIUM PHOSPHATE 4 MG/ML IJ SOLN
INTRAMUSCULAR | Status: DC | PRN
  Administered 2018-07-25: 4 mg via INTRAVENOUS

## 2018-07-25 MED ORDER — LACTATED RINGERS IV SOLN
INTRAVENOUS | Status: DC | PRN
  Administered 2018-07-25 (×2): via INTRAVENOUS

## 2018-07-25 MED ORDER — LACTATED RINGERS IV SOLN
INTRAVENOUS | Status: DC
  Administered 2018-07-25 (×2): via INTRAVENOUS

## 2018-07-25 MED ORDER — PHENYLEPHRINE 40 MCG/ML (10ML) SYRINGE FOR IV PUSH (FOR BLOOD PRESSURE SUPPORT): 80.0000 ug | PREFILLED_SYRINGE | INTRAVENOUS | Status: DC | PRN

## 2018-07-25 MED ORDER — OXYTOCIN 10 UNIT/ML IJ SOLN
INTRAMUSCULAR | Status: AC
  Filled 2018-07-25: qty 4

## 2018-07-25 MED ORDER — ACETAMINOPHEN 325 MG PO TABS: 650.0000 mg | ORAL_TABLET | ORAL | Status: DC | PRN

## 2018-07-25 MED ORDER — NALOXONE HCL 0.4 MG/ML IJ SOLN: 0.4000 mg | INTRAMUSCULAR | Status: DC | PRN

## 2018-07-25 MED ORDER — SENNOSIDES-DOCUSATE SODIUM 8.6-50 MG PO TABS
2.0000 | ORAL_TABLET | ORAL | Status: DC
  Administered 2018-07-25 – 2018-07-27 (×3): 2 via ORAL
  Filled 2018-07-25 (×3): qty 2

## 2018-07-25 MED ORDER — FENTANYL 2.5 MCG/ML BUPIVACAINE 1/10 % EPIDURAL INFUSION (WH - ANES)
14.0000 mL/h | INTRAMUSCULAR | Status: DC | PRN
  Administered 2018-07-25: 14 mL/h via EPIDURAL
  Filled 2018-07-25: qty 100

## 2018-07-25 MED ORDER — DEXAMETHASONE SODIUM PHOSPHATE 4 MG/ML IJ SOLN
INTRAMUSCULAR | Status: AC
  Filled 2018-07-25: qty 1

## 2018-07-25 MED ORDER — OXYTOCIN 40 UNITS IN NORMAL SALINE INFUSION - SIMPLE MED: 2.5000 [IU]/h | INTRAVENOUS | Status: DC

## 2018-07-25 MED ORDER — WITCH HAZEL-GLYCERIN EX PADS: 1.0000 "application " | MEDICATED_PAD | CUTANEOUS | Status: DC | PRN

## 2018-07-25 MED ORDER — ONDANSETRON HCL 4 MG/2ML IJ SOLN: 4.0000 mg | Freq: Three times a day (TID) | INTRAMUSCULAR | Status: DC | PRN

## 2018-07-25 MED ORDER — DIPHENHYDRAMINE HCL 50 MG/ML IJ SOLN: 12.5000 mg | INTRAMUSCULAR | Status: DC | PRN

## 2018-07-25 MED ORDER — COCONUT OIL OIL
1.0000 "application " | TOPICAL_OIL | Status: DC | PRN
  Filled 2018-07-25: qty 120

## 2018-07-25 MED ORDER — SIMETHICONE 80 MG PO CHEW
80.0000 mg | CHEWABLE_TABLET | ORAL | Status: DC
  Administered 2018-07-25 – 2018-07-27 (×3): 80 mg via ORAL
  Filled 2018-07-25 (×3): qty 1

## 2018-07-25 MED ORDER — SIMETHICONE 80 MG PO CHEW
80.0000 mg | CHEWABLE_TABLET | Freq: Three times a day (TID) | ORAL | Status: DC
  Administered 2018-07-26 – 2018-07-28 (×6): 80 mg via ORAL
  Filled 2018-07-25 (×7): qty 1

## 2018-07-25 MED ORDER — PHENYLEPHRINE 40 MCG/ML (10ML) SYRINGE FOR IV PUSH (FOR BLOOD PRESSURE SUPPORT)
PREFILLED_SYRINGE | INTRAVENOUS | Status: AC
  Filled 2018-07-25: qty 10

## 2018-07-25 MED ORDER — DIBUCAINE 1 % RE OINT: 1.0000 "application " | TOPICAL_OINTMENT | RECTAL | Status: DC | PRN

## 2018-07-25 MED ORDER — DIPHENHYDRAMINE HCL 25 MG PO CAPS
25.0000 mg | ORAL_CAPSULE | ORAL | Status: DC | PRN
  Filled 2018-07-25: qty 1

## 2018-07-25 MED ORDER — ONDANSETRON HCL 4 MG/2ML IJ SOLN
INTRAMUSCULAR | Status: AC
  Filled 2018-07-25: qty 4

## 2018-07-25 MED ORDER — PROMETHAZINE HCL 25 MG/ML IJ SOLN
6.2500 mg | Freq: Once | INTRAMUSCULAR | Status: AC
  Administered 2018-07-25: 6.25 mg via INTRAVENOUS
  Filled 2018-07-25: qty 1

## 2018-07-25 MED ORDER — LACTATED RINGERS IV SOLN: 500.0000 mL | Freq: Once | INTRAVENOUS | Status: DC

## 2018-07-25 MED ORDER — TETANUS-DIPHTH-ACELL PERTUSSIS 5-2.5-18.5 LF-MCG/0.5 IM SUSP: 0.5000 mL | Freq: Once | INTRAMUSCULAR | Status: DC

## 2018-07-25 MED ORDER — OXYCODONE-ACETAMINOPHEN 5-325 MG PO TABS: 1.0000 | ORAL_TABLET | ORAL | Status: DC | PRN

## 2018-07-25 MED ORDER — FENTANYL CITRATE (PF) 100 MCG/2ML IJ SOLN
25.0000 ug | INTRAMUSCULAR | Status: DC | PRN
  Administered 2018-07-25: 25 ug via INTRAVENOUS

## 2018-07-25 MED ORDER — PRENATAL MULTIVITAMIN CH
1.0000 | ORAL_TABLET | Freq: Every day | ORAL | Status: DC
  Administered 2018-07-26 – 2018-07-27 (×2): 1 via ORAL
  Filled 2018-07-25 (×3): qty 1

## 2018-07-25 MED ORDER — CEFAZOLIN SODIUM-DEXTROSE 2-3 GM-%(50ML) IV SOLR
INTRAVENOUS | Status: DC | PRN
  Administered 2018-07-25: 2 g via INTRAVENOUS

## 2018-07-25 MED ORDER — OXYTOCIN 40 UNITS IN NORMAL SALINE INFUSION - SIMPLE MED: 2.5000 [IU]/h | INTRAVENOUS | Status: AC

## 2018-07-25 MED ORDER — CEFAZOLIN SODIUM-DEXTROSE 2-4 GM/100ML-% IV SOLN
INTRAVENOUS | Status: AC
  Filled 2018-07-25: qty 100

## 2018-07-25 MED ORDER — LIDOCAINE HCL (CARDIAC) PF 100 MG/5ML IV SOSY
PREFILLED_SYRINGE | INTRAVENOUS | Status: DC | PRN
  Administered 2018-07-25: 2 mL via INTRAVENOUS

## 2018-07-25 MED ORDER — PHENYLEPHRINE 40 MCG/ML (10ML) SYRINGE FOR IV PUSH (FOR BLOOD PRESSURE SUPPORT)
80.0000 ug | PREFILLED_SYRINGE | INTRAVENOUS | Status: DC | PRN
  Filled 2018-07-25: qty 10

## 2018-07-25 MED ORDER — SODIUM BICARBONATE 8.4 % IV SOLN
INTRAVENOUS | Status: DC | PRN
  Administered 2018-07-25: 5 mL via EPIDURAL
  Administered 2018-07-25: 3 mL via EPIDURAL
  Administered 2018-07-25: 5 mL via EPIDURAL

## 2018-07-25 MED ORDER — ACETAMINOPHEN 500 MG PO TABS
1000.0000 mg | ORAL_TABLET | Freq: Four times a day (QID) | ORAL | Status: AC
  Administered 2018-07-25 – 2018-07-26 (×3): 1000 mg via ORAL
  Filled 2018-07-25 (×3): qty 2

## 2018-07-25 MED ORDER — KETOROLAC TROMETHAMINE 30 MG/ML IJ SOLN: 30.0000 mg | Freq: Four times a day (QID) | INTRAMUSCULAR | Status: AC | PRN

## 2018-07-25 MED ORDER — SOD CITRATE-CITRIC ACID 500-334 MG/5ML PO SOLN
30.0000 mL | ORAL | Status: DC | PRN
  Administered 2018-07-25: 30 mL via ORAL
  Filled 2018-07-25: qty 15

## 2018-07-25 MED ORDER — LIDOCAINE HCL (PF) 1 % IJ SOLN
30.0000 mL | INTRAMUSCULAR | Status: AC | PRN
  Administered 2018-07-25 (×2): 4 mL via SUBCUTANEOUS

## 2018-07-25 MED ORDER — OXYTOCIN BOLUS FROM INFUSION: 500.0000 mL | Freq: Once | INTRAVENOUS | Status: DC

## 2018-07-25 MED ORDER — LIDOCAINE HCL (CARDIAC) PF 100 MG/5ML IV SOSY
PREFILLED_SYRINGE | INTRAVENOUS | Status: AC
  Filled 2018-07-25: qty 5

## 2018-07-25 MED ORDER — FENTANYL CITRATE (PF) 100 MCG/2ML IJ SOLN
INTRAMUSCULAR | Status: AC
  Filled 2018-07-25: qty 2

## 2018-07-25 MED ORDER — EPHEDRINE 5 MG/ML INJ: 10.0000 mg | INTRAVENOUS | Status: DC | PRN

## 2018-07-25 MED ORDER — SODIUM CHLORIDE 0.9% FLUSH: 3.0000 mL | INTRAVENOUS | Status: DC | PRN

## 2018-07-25 MED ORDER — ONDANSETRON HCL 4 MG/2ML IJ SOLN
4.0000 mg | Freq: Four times a day (QID) | INTRAMUSCULAR | Status: DC | PRN
  Administered 2018-07-25: 4 mg via INTRAVENOUS
  Filled 2018-07-25: qty 2

## 2018-07-25 MED ORDER — KETOROLAC TROMETHAMINE 30 MG/ML IJ SOLN: 30.0000 mg | Freq: Once | INTRAMUSCULAR | Status: DC | PRN

## 2018-07-25 MED ORDER — FLEET ENEMA 7-19 GM/118ML RE ENEM: 1.0000 | ENEMA | RECTAL | Status: DC | PRN

## 2018-07-25 MED ORDER — NALBUPHINE HCL 10 MG/ML IJ SOLN: 5.0000 mg | INTRAMUSCULAR | Status: DC | PRN

## 2018-07-25 MED ORDER — PROMETHAZINE HCL 25 MG/ML IJ SOLN: 6.2500 mg | INTRAMUSCULAR | Status: DC | PRN

## 2018-07-25 MED ORDER — NALOXONE HCL 4 MG/10ML IJ SOLN
1.0000 ug/kg/h | INTRAVENOUS | Status: DC | PRN
  Filled 2018-07-25: qty 5

## 2018-07-25 MED ORDER — MORPHINE SULFATE (PF) 0.5 MG/ML IJ SOLN
INTRAMUSCULAR | Status: DC | PRN
  Administered 2018-07-25: 3 mg via EPIDURAL

## 2018-07-25 MED ORDER — SODIUM CHLORIDE 0.9 % IR SOLN
Status: DC | PRN
  Administered 2018-07-25: 1000 mL

## 2018-07-25 MED ORDER — ONDANSETRON HCL 4 MG/2ML IJ SOLN
INTRAMUSCULAR | Status: DC | PRN
  Administered 2018-07-25: 4 mg via INTRAVENOUS

## 2018-07-25 MED ORDER — LACTATED RINGERS IV SOLN
INTRAVENOUS | Status: DC
  Administered 2018-07-25: 22:00:00 via INTRAVENOUS

## 2018-07-25 MED ORDER — SIMETHICONE 80 MG PO CHEW
80.0000 mg | CHEWABLE_TABLET | ORAL | Status: DC | PRN
  Administered 2018-07-26: 80 mg via ORAL

## 2018-07-25 MED ORDER — FENTANYL CITRATE (PF) 100 MCG/2ML IJ SOLN
INTRAMUSCULAR | Status: DC | PRN
  Administered 2018-07-25: 100 ug via EPIDURAL

## 2018-07-25 MED ORDER — LACTATED RINGERS IV SOLN
500.0000 mL | INTRAVENOUS | Status: DC | PRN
  Administered 2018-07-25: 1000 mL via INTRAVENOUS

## 2018-07-25 MED ORDER — MISOPROSTOL 25 MCG QUARTER TABLET
25.0000 ug | ORAL_TABLET | ORAL | Status: DC | PRN
  Administered 2018-07-25 (×3): 25 ug via VAGINAL
  Filled 2018-07-25 (×4): qty 1

## 2018-07-25 MED ORDER — FENTANYL CITRATE (PF) 100 MCG/2ML IJ SOLN
100.0000 ug | INTRAMUSCULAR | Status: DC | PRN
  Filled 2018-07-25: qty 2

## 2018-07-25 MED ORDER — NALBUPHINE HCL 10 MG/ML IJ SOLN: 5.0000 mg | Freq: Once | INTRAMUSCULAR | Status: DC | PRN

## 2018-07-25 MED ORDER — ACETAMINOPHEN 10 MG/ML IV SOLN
INTRAVENOUS | Status: AC
  Administered 2018-07-25: 1000 mg
  Filled 2018-07-25: qty 100

## 2018-07-25 MED ORDER — OXYTOCIN 10 UNIT/ML IJ SOLN
INTRAVENOUS | Status: DC | PRN
  Administered 2018-07-25: 40 [IU] via INTRAVENOUS

## 2018-07-25 MED ORDER — OXYCODONE-ACETAMINOPHEN 5-325 MG PO TABS: 2.0000 | ORAL_TABLET | ORAL | Status: DC | PRN

## 2018-07-25 MED ORDER — ZOLPIDEM TARTRATE 5 MG PO TABS: 5.0000 mg | ORAL_TABLET | Freq: Every evening | ORAL | Status: DC | PRN

## 2018-07-25 MED ORDER — ACETAMINOPHEN 325 MG PO TABS
650.0000 mg | ORAL_TABLET | ORAL | Status: DC | PRN
  Administered 2018-07-26 – 2018-07-28 (×5): 650 mg via ORAL
  Filled 2018-07-25 (×5): qty 2

## 2018-07-25 MED ORDER — IBUPROFEN 800 MG PO TABS
800.0000 mg | ORAL_TABLET | Freq: Three times a day (TID) | ORAL | Status: DC
  Administered 2018-07-26 – 2018-07-28 (×7): 800 mg via ORAL
  Filled 2018-07-25 (×7): qty 1

## 2018-07-25 MED ORDER — MENTHOL 3 MG MT LOZG: 1.0000 | LOZENGE | OROMUCOSAL | Status: DC | PRN

## 2018-07-25 MED ORDER — SODIUM BICARBONATE 8.4 % IV SOLN
INTRAVENOUS | Status: AC
  Filled 2018-07-25: qty 50

## 2018-07-25 MED ORDER — MORPHINE SULFATE (PF) 0.5 MG/ML IJ SOLN
INTRAMUSCULAR | Status: AC
  Filled 2018-07-25: qty 10

## 2018-07-25 MED ORDER — LIDOCAINE-EPINEPHRINE (PF) 2 %-1:200000 IJ SOLN
INTRAMUSCULAR | Status: AC
  Filled 2018-07-25: qty 20

## 2018-07-25 SURGICAL SUPPLY — 33 items
BENZOIN TINCTURE PRP APPL 2/3 (GAUZE/BANDAGES/DRESSINGS) ×2 IMPLANT
CHLORAPREP W/TINT 26ML (MISCELLANEOUS) ×2 IMPLANT
CLAMP CORD UMBIL (MISCELLANEOUS) IMPLANT
CLOTH BEACON ORANGE TIMEOUT ST (SAFETY) ×2 IMPLANT
DRSG OPSITE POSTOP 4X10 (GAUZE/BANDAGES/DRESSINGS) ×2 IMPLANT
ELECT REM PT RETURN 9FT ADLT (ELECTROSURGICAL) ×2
ELECTRODE REM PT RTRN 9FT ADLT (ELECTROSURGICAL) ×1 IMPLANT
EXTRACTOR VACUUM M CUP 4 TUBE (SUCTIONS) IMPLANT
GLOVE BIOGEL PI IND STRL 6.5 (GLOVE) ×1 IMPLANT
GLOVE BIOGEL PI IND STRL 7.0 (GLOVE) ×1 IMPLANT
GLOVE BIOGEL PI INDICATOR 6.5 (GLOVE) ×1
GLOVE BIOGEL PI INDICATOR 7.0 (GLOVE) ×1
GLOVE ECLIPSE 6.5 STRL STRAW (GLOVE) ×2 IMPLANT
GOWN STRL REUS W/TWL LRG LVL3 (GOWN DISPOSABLE) ×4 IMPLANT
KIT ABG SYR 3ML LUER SLIP (SYRINGE) ×2 IMPLANT
NEEDLE HYPO 25X5/8 SAFETYGLIDE (NEEDLE) ×2 IMPLANT
NS IRRIG 1000ML POUR BTL (IV SOLUTION) ×2 IMPLANT
PACK C SECTION WH (CUSTOM PROCEDURE TRAY) ×2 IMPLANT
PAD ABD 7.5X8 STRL (GAUZE/BANDAGES/DRESSINGS) IMPLANT
PAD OB MATERNITY 4.3X12.25 (PERSONAL CARE ITEMS) ×2 IMPLANT
PENCIL SMOKE EVAC W/HOLSTER (ELECTROSURGICAL) ×2 IMPLANT
RTRCTR C-SECT PINK 25CM LRG (MISCELLANEOUS) ×2 IMPLANT
STRIP CLOSURE SKIN 1/2X4 (GAUZE/BANDAGES/DRESSINGS) ×2 IMPLANT
SUT CHROMIC 2 0 CT 1 (SUTURE) ×2 IMPLANT
SUT MON AB 2-0 CT1 27 (SUTURE) ×2 IMPLANT
SUT MON AB 4-0 PS1 27 (SUTURE) IMPLANT
SUT PDS AB 0 CTX 60 (SUTURE) IMPLANT
SUT PLAIN 2 0 XLH (SUTURE) IMPLANT
SUT VIC AB 0 CTX 36 (SUTURE) ×4
SUT VIC AB 0 CTX36XBRD ANBCTRL (SUTURE) ×4 IMPLANT
SUT VIC AB 4-0 KS 27 (SUTURE) IMPLANT
TOWEL OR 17X24 6PK STRL BLUE (TOWEL DISPOSABLE) ×2 IMPLANT
TRAY FOLEY W/BAG SLVR 14FR LF (SET/KITS/TRAYS/PACK) ×2 IMPLANT

## 2018-07-25 NOTE — H&P (Signed)
33 y.o. [redacted]w[redacted]d  G1P0 comes in for schedule IOL for post dates at 40.5.  Otherwise has good fetal movement and no bleeding.  Past Medical History:  Diagnosis Date  . GERD (gastroesophageal reflux disease)     Past Surgical History:  Procedure Laterality Date  . TONSILLECTOMY    . WISDOM TOOTH EXTRACTION      OB History  Gravida Para Term Preterm AB Living  1            SAB TAB Ectopic Multiple Live Births               # Outcome Date GA Lbr Len/2nd Weight Sex Delivery Anes PTL Lv  1 Current             Social History   Socioeconomic History  . Marital status: Married    Spouse name: Octavia Bruckner  . Number of children: Not on file  . Years of education: Not on file  . Highest education level: Not on file  Occupational History  . Not on file  Social Needs  . Financial resource strain: Not hard at all  . Food insecurity:    Worry: Never true    Inability: Never true  . Transportation needs:    Medical: No    Non-medical: Not on file  Tobacco Use  . Smoking status: Never Smoker  . Smokeless tobacco: Never Used  Substance and Sexual Activity  . Alcohol use: Not Currently    Comment: prior to preg  . Drug use: Never  . Sexual activity: Yes  Lifestyle  . Physical activity:    Days per week: Not on file    Minutes per session: Not on file  . Stress: To some extent  Relationships  . Social connections:    Talks on phone: Not on file    Gets together: Not on file    Attends religious service: Not on file    Active member of club or organization: Not on file    Attends meetings of clubs or organizations: Not on file    Relationship status: Not on file  . Intimate partner violence:    Fear of current or ex partner: No    Emotionally abused: No    Physically abused: No    Forced sexual activity: No  Other Topics Concern  . Not on file  Social History Narrative  . Not on file   Patient has no known allergies.    Prenatal Transfer Tool  Maternal Diabetes: No Genetic  Screening: Declined Maternal Ultrasounds/Referrals: Abnormal:  Findings:   Other:  2VC, [place] 36 weeks 5#13 (31%) Fetal Ultrasounds or other Referrals:  None Maternal Substance Abuse:  No Significant Maternal Medications:  None Significant Maternal Lab Results: Lab values include: Group B Strep negative  Other PNC: uncomplicated.    Vitals:   07/25/18 0701 07/25/18 0801 07/25/18 0826 07/25/18 0901  BP: 132/78 136/75  (!) 150/87  Pulse: 89 81  81  Resp:   18   Temp:   98.9 F (37.2 C)   TempSrc:   Oral   Weight:      Height:        Lungs/Cor:  NAD Abdomen:  soft, gravid Ex:  no cords, erythema SVE:  1.5/50/-3 FHTs:  130 good STV, NST R; Cat 1 tracing. Toco:  irregular   A/P   Admitted for IOL for postdates  Now s/p cytotec x 3  GBS Neg  Routine care  Allyn Kenner

## 2018-07-25 NOTE — Transfer of Care (Signed)
Immediate Anesthesia Transfer of Care Note  Patient: [name]  Procedure(s) Performed: CESAREAN SECTION (N/A )  Patient Location: PACU  Anesthesia Type:Epidural  Level of Consciousness: awake, alert  and oriented  Airway & Oxygen Therapy: Patient Spontanous Breathing  Post-op Assessment: Report given to RN and Post -op Vital signs reviewed and stable  Post vital signs: Reviewed and stable HR 90, RR 13, SaO2100%, BP 146/67  Last Vitals:  Vitals Value Taken Time  BP 135/69 07/25/2018  2:45 PM  Temp    Pulse 85 07/25/2018  2:47 PM  Resp 16 07/25/2018  2:47 PM  SpO2 100 % 07/25/2018  2:47 PM  Vitals shown include unvalidated device data.  Last Pain:  Vitals:   07/25/18 1212  TempSrc:   PainSc: 8          Complications: No apparent anesthesia complications

## 2018-07-25 NOTE — Anesthesia Preprocedure Evaluation (Signed)
Anesthesia Evaluation  Patient identified by MRN, date of birth, ID band Patient awake    Reviewed: Allergy & Precautions, NPO status , Patient's Chart, lab work & pertinent test results  History of Anesthesia Complications Negative for: history of anesthetic complications  Airway Mallampati: II  TM Distance: >3 FB Neck ROM: Full    Dental  (+) Teeth Intact, Dental Advisory Given   Pulmonary neg pulmonary ROS,    Pulmonary exam normal breath sounds clear to auscultation       Cardiovascular negative cardio ROS Normal cardiovascular exam Rhythm:Regular Rate:Normal     Neuro/Psych negative neurological ROS     GI/Hepatic Neg liver ROS, GERD  Medicated and Controlled,  Endo/Other  negative endocrine ROS  Renal/GU negative Renal ROS     Musculoskeletal negative musculoskeletal ROS (+)   Abdominal   Peds  Hematology negative hematology ROS (+)   Anesthesia Other Findings Day of surgery medications reviewed with the patient.  Reproductive/Obstetrics (+) Pregnancy                             Anesthesia Physical Anesthesia Plan  ASA: II and emergent  Anesthesia Plan: Epidural   Post-op Pain Management:    Induction:   PONV Risk Score and Plan: 2 and Treatment may vary due to age or medical condition, Ondansetron and Dexamethasone  Airway Management Planned: Natural Airway  Additional Equipment:   Intra-op Plan:   Post-operative Plan:   Informed Consent: I have reviewed the patients History and Physical, chart, labs and discussed the procedure including the risks, benefits and alternatives for the proposed anesthesia with the patient or authorized representative who has indicated his/her understanding and acceptance.     Dental advisory given  Plan Discussed with: CRNA  Anesthesia Plan Comments: (Epidural to be used for C/S.)        Anesthesia Quick Evaluation

## 2018-07-25 NOTE — Op Note (Signed)
[name], [name] MEDICAL RECORD ON:[id number] ACCOUNT [id number] DATE OF BIRTH:1986-01-25 FACILITY: [place] LOCATION: [place], DO  OPERATIVE REPORT  DATE OF PROCEDURE:  07/25/2018  PREOPERATIVE DIAGNOSIS:  Nonreassuring fetal heart tones.  POSTOPERATIVE DIAGNOSIS:  Nonreassuring fetal heart tones  OPERATIVE PROCEDURE:  Low transverse cesarean section.  SURGEON:  Allyn Kenner, DO  ANESTHESIA:  Epidural.  ESTIMATED BLOOD LOSS:  Per anesthesia 442 mL.  URINE OUTPUT AND IV FLUIDS:  Please see anesthesia report.  FINDINGS:  Female infant in cephalic presentation, thick meconium, thin umbilical cord that was highly compressible.  Normal tubes and ovaries bilaterally.  Apgars 9 and 9, weight pending.  SPECIMENS:  Cord blood and blood sample for pH.  COMPLICATIONS:  None.  CONDITION:  Stable to PACU.  DESCRIPTION OF PROCEDURE:  The patient was taken to the operating room where epidural anesthesia was found to be adequate.  She was prepped and draped in the normal sterile fashion in dorsal supine position with a leftward tilt.  A scalpel was used to  make a Pfannenstiel skin incision and carried down to underlying layer of fascia with Bovie cautery.  The fascia was incised at the midline with the scalpel and extended laterally with Mayo scissors.  Kocher clamps were placed at the superior aspect of  the fascial incision, and rectus muscles were dissected off bluntly and sharply.  Kocher clamps were then placed at the inferior aspect of the fascial incision, and rectus muscles were dissected off bluntly and sharply.  Hemostat was used to separate  rectus muscle superiorly with good visualization of the peritoneum.  It was entered bluntly.  Manual lateral traction was performed to enlarge the peritoneal incision.  The abdomen and pelvis were manually surveyed and found to be normal.  An Engineer, site was placed.  Vesicouterine peritoneum was  identified, tented, and entered sharply with Metzenbaum scissors and extended laterally with Metzenbaum scissors.  The bladder flap was developed digitally.  A scalpel was used to make a low  transverse cesarean incision, and the amniotic sac was entered sharply with abundant thick meconium emanating from the hysterotomy.  The hysterotomy was extended by cephalic and caudal traction.  Infant's head was easily located, elevated and delivered  without difficulty followed by the remainder of the infant's body.  The infant was bulb suctioned, and neonatology okayed delayed cord clamping for 30 seconds while the baby was warmed.  External massage of the uterus was performed to facilitate  increasing tone.  After 30 seconds, the cord was doubly clamped, cut, and the infant was handed off to awaiting neonatology.  The cord blood was collected and gentle traction on the umbilical cord with external massage of the uterus facilitated delivery  of placenta.  The uterus was cleared of all clot and debris.  No additional membranes were noted.  The uterine incision was reapproximated and closed with Vicryl in a running locked fashion followed by a second layer of vertical imbrication.  Two  additional figure-of-eights were placed, 1 in the left side of the incision where serosal bleeding was noted and 1 just left of midline also for serosal bleeding.  Excellent hemostasis was noted.  The Alexis self-retractor was removed, and the incision  was reexamined.  The peritoneum was then reapproximated and closed with Monocryl in a running fashion, and 3 separated interrupted sutures were placed to reapproximate the inferior aspect of the rectus muscles.  The fascia was reapproximated and closed  with Vicryl in a running  fashion, and the subcutaneous tissue was irrigated, dried and minimal use of Bovie cautery was needed for excellent hemostasis.  Five interrupted sutures were placed with chromic subcutaneously, and then the skin  was  reapproximated and closed with Vicryl on a Keith needle subcuticularly.  The patient tolerated the procedure well.  Sponge, lap and needle counts were correct x2.  The patient was taken to recovery in stable condition.  LN/NUANCE  D:07/25/2018 T:07/25/2018 JOB:[id number]

## 2018-07-25 NOTE — Progress Notes (Signed)
Pt was having intermittent variable decels, Rn called to report pt requested IV pain meds, but she felt that was not a good idea given variables, she reported that overall variability was good and accels present.  I advised ok to proceed with epidural.  I observed strip after placement and found variable decels to be progressively deeper with continued moderate variability and no accels.  Supportive measures were performed and patient position changes without resolution of decelerations.  I came to bedside and had terbultaline administered with resolution of decels and return to 140s-150s baseline with moderate variability.  I discussed my concern for cord issue or placental insufficiency in light of early labor and recommended proceeding to c/s.  Pt was consented and agreed.

## 2018-07-25 NOTE — Anesthesia Postprocedure Evaluation (Signed)
Anesthesia Post Note  Patient: [name]  Procedure(s) Performed: CESAREAN SECTION (N/A )     Patient location during evaluation: PACU Anesthesia Type: Epidural Level of consciousness: awake and alert Pain management: pain level controlled Vital Signs Assessment: post-procedure vital signs reviewed and stable Respiratory status: spontaneous breathing, nonlabored ventilation and respiratory function stable Cardiovascular status: blood pressure returned to baseline and stable Postop Assessment: no apparent nausea or vomiting and epidural receding Anesthetic complications: no    Last Vitals:  Vitals:   07/25/18 1515 07/25/18 1530  BP: 101/83 128/76  Pulse: 91 83  Resp: 16 18  Temp:    SpO2: 99% 99%    Last Pain:  Vitals:   07/25/18 1530  TempSrc:   PainSc: 1    Pain Goal:                Epidural/Spinal Function Cutaneous sensation: Able to Wiggle Toes (07/25/18 1530), Patient able to flex knees: Yes (07/25/18 1530), Patient able to lift hips off bed: Yes (07/25/18 1530), Back pain beyond tenderness at insertion site: No (07/25/18 1530), Progressively worsening motor and/or sensory loss: No (07/25/18 1530), Bowel and/or bladder incontinence post epidural: No (07/25/18 1530)  Brennan Bailey

## 2018-07-25 NOTE — Anesthesia Pain Management Evaluation Note (Signed)
  CRNA Pain Management Visit Note  Patient: [name], 33 y.o., female  "Hello I am a member of the anesthesia team at [facility]. We have an anesthesia team available at all times to provide care throughout the hospital, including epidural management and anesthesia for C-section. I don't know your plan for the delivery whether it a natural birth, water birth, IV sedation, nitrous supplementation, doula or epidural, but we want to meet your pain goals."   1.Was your pain managed to your expectations on prior hospitalizations?   No prior hospitalizations  2.What is your expectation for pain management during this hospitalization?     Labor support without medications  3.How can we help you reach that goal? Support and patient aware of pain control options  Record the patient's initial score and the patient's pain goal.   Pain: 7  Pain Goal: 9 [facility] wants you to be able to say your pain was always managed very well.  [name] 07/25/2018

## 2018-07-25 NOTE — Brief Op Note (Signed)
07/25/2018  2:34 PM  PATIENT:  [name]  33 y.o. female  PRE-OPERATIVE DIAGNOSIS:  non reassuring fetal heart rate  POST-OPERATIVE DIAGNOSIS:  non reassuring fetal heart rate  PROCEDURE:  Procedure(s): CESAREAN SECTION (N/A) low transverse  SURGEON:  Surgeon(s) and Role:    Rogue Bussing, Lamoyne Palencia, DO - Primary  ANESTHESIA:   epidural  EBL:  442 mL   FINDINGS: female infant, cephalic, thick meconium, thin umbilical cord, highly compressable, with normal tubes and ovaries bilaterally, APGARS 9/9, wt pending.  BLOOD ADMINISTERED:none  SPECIMEN:  Source of Specimen:  cord blood, and pH sample  DISPOSITION OF SPECIMEN:  N/A  COUNTS:  YES  PLAN OF CARE: Admit to inpatient   PATIENT DISPOSITION:  PACU - hemodynamically stable.   Delay start of Pharmacological VTE agent (>24hrs) due to surgical blood loss or risk of bleeding: not applicable

## 2018-07-25 NOTE — Anesthesia Procedure Notes (Signed)
Epidural Patient location during procedure: OB Start time: 07/25/2018 12:13 PM End time: 07/25/2018 12:16 PM  Staffing Anesthesiologist: Brennan Bailey, MD Performed: anesthesiologist   Preanesthetic Checklist Completed: patient identified, pre-op evaluation, timeout performed, IV checked, risks and benefits discussed and monitors and equipment checked  Epidural Patient position: sitting Prep: site prepped and draped and DuraPrep Patient monitoring: continuous pulse ox, blood pressure, heart rate and cardiac monitor Approach: midline Location: L3-L4 Injection technique: LOR air  Needle:  Needle type: Tuohy  Needle gauge: 17 G Needle length: 9 cm Needle insertion depth: 5 cm Catheter type: closed end flexible Catheter size: 19 Gauge Catheter at skin depth: 10 cm Test dose: negative and Other (1% lidocaine)  Assessment Events: blood not aspirated, injection not painful, no injection resistance, negative IV test and no paresthesia  Additional Notes Patient identified. Risks, benefits, and alternatives discussed with patient including but not limited to bleeding, infection, nerve damage, paralysis, failed block, incomplete pain control, headache, blood pressure changes, nausea, vomiting, reactions to medication, itching, and postpartum back pain. Confirmed with bedside nurse the patient's most recent platelet count. Confirmed with patient that they are not currently taking any anticoagulation, have any bleeding history, or any family history of bleeding disorders. Patient expressed understanding and wished to proceed. All questions were answered. Sterile technique was used throughout the entire procedure. Crisp LOR on first pass. Please see nursing notes for vital signs. Test dose was given through epidural catheter and negative prior to continuing to dose epidural or start infusion. Warning signs of high block given to the patient including shortness of breath, tingling/numbness in  hands, complete motor block, or any concerning symptoms with instructions to call for help. Patient was given instructions on fall risk and not to get out of bed. All questions and concerns addressed with instructions to call with any issues or inadequate analgesia.  Reason for block:procedure for pain

## 2018-07-26 ENCOUNTER — Encounter (HOSPITAL_COMMUNITY): Payer: Self-pay | Admitting: Obstetrics and Gynecology

## 2018-07-26 LAB — CBC
HCT: 28.5 % — ABNORMAL LOW (ref 36.0–46.0)
Hemoglobin: 8.8 g/dL — ABNORMAL LOW (ref 12.0–15.0)
MCH: 26.5 pg (ref 26.0–34.0)
MCHC: 30.9 g/dL (ref 30.0–36.0)
MCV: 85.8 fL (ref 80.0–100.0)
Platelets: 159 10*3/uL (ref 150–400)
RBC: 3.32 MIL/uL — AB (ref 3.87–5.11)
RDW: 16 % — ABNORMAL HIGH (ref 11.5–15.5)
WBC: 12.5 10*3/uL — ABNORMAL HIGH (ref 4.0–10.5)
nRBC: 0 % (ref 0.0–0.2)

## 2018-07-26 MED ORDER — FERROUS SULFATE 325 (65 FE) MG PO TABS
325.0000 mg | ORAL_TABLET | Freq: Two times a day (BID) | ORAL | Status: DC
  Administered 2018-07-26 – 2018-07-28 (×4): 325 mg via ORAL
  Filled 2018-07-26 (×4): qty 1

## 2018-07-26 NOTE — Progress Notes (Signed)
MOB was referred for history of depression/anxiety. * Referral screened out by Clinical Social Worker because none of the following criteria appear to apply: ~ History of anxiety/depression during this pregnancy, or of post-partum depression following prior delivery. ~ Diagnosis of anxiety and/or depression within last 3 years OR * MOB's symptoms currently being treated with medication and/or therapy. Per [facility] records, MOB has a Social worker.   Please contact the Clinical Social Worker if needs arise, by [facility] request, or if MOB scores greater than 9/yes to question 10 on Edinburgh Postpartum Depression Screen.  Laurey Arrow, MSW, LCSW Clinical Social Work [phone]

## 2018-07-26 NOTE — Lactation Note (Signed)
This note was copied from a baby's chart. Lactation Consultation Note  Patient Name: Girl Laterrica Libman [id number] Date: 07/26/2018 Reason for consult: Follow-up assessment;Term;Primapara;1st time breastfeeding  P1 mother whose infant is now 59 hours old.  Baby asleep in visitor's arms when I arrived.  Mother had no questions/concerns related to breast feeding.  She stated that baby is latching better.  Her breasts are soft and non tender and nipples are intact.  Mother will continue to feed 8-12 times/24 hours or sooner if baby shows cues.  She is familiar with hand expression.  Mother will call for latch assistance as needed.   Maternal Data Formula Feeding for Exclusion: No Has patient been taught Hand Expression?: Yes Does the patient have breastfeeding experience prior to this delivery?: No  Feeding    LATCH Score                   Interventions    Lactation Tools Discussed/Used WIC Program: No   Consult Status Consult Status: Follow-up Date: 07/27/18 Follow-up type: In-patient    Adaline Trejos R Delanda Bulluck 07/26/2018, 3:20 PM

## 2018-07-26 NOTE — Addendum Note (Signed)
Addendum  created 07/26/18 0947 by Asher Muir, CRNA   Clinical Note Signed

## 2018-07-26 NOTE — Lactation Note (Addendum)
This note was copied from a baby's chart. Lactation Consultation Note  Patient Name: [name] [id number] Date: 07/26/2018 Reason for consult: Initial assessment;1st time breastfeeding;Term P1, 29 hour female infant. Per mom, infant latched well at 11 pm and 3 pm infant breastfeed for 10 minutes. Infant is latching  On right breast but not as well with left breast.  Mom latched infant on right breast using football hold, infant did not sustain latch at first but started suckling well. [place] notice mom  has flat breast and breast shells were given.  Mom was still breastfeeding when [facility] left room. Mom will call for assistance from Nurse or [place] if she has any questions, concerns or need latch assistance. Mom knows to breastfeed acording hunger cues and no exceed 3 hours without breastfeeding infant.  LC discussed I & O. Reviewed Baby & Me book's Breastfeeding Basics.  Mom made aware of O/P services, breastfeeding support groups, community resources, and our phone # for post-discharge questions.  Maternal Data Formula Feeding for Exclusion: No Has patient been taught Hand Expression?: Yes(MOm has colostrum present in breast.) Does the patient have breastfeeding experience prior to this delivery?: No  Feeding Feeding Type: Breast Fed  LATCH Score Latch: Repeated attempts needed to sustain latch, nipple held in mouth throughout feeding, stimulation needed to elicit sucking reflex.  Audible Swallowing: A few with stimulation  Type of Nipple: Flat  Comfort (Breast/Nipple): Soft / non-tender  Hold (Positioning): Assistance needed to correctly position infant at breast and maintain latch.  LATCH Score: 6  Interventions Interventions: Breast feeding basics reviewed;Assisted with latch;Skin to skin;Breast massage;Hand express;Support pillows;Adjust position;Breast compression;Expressed milk;Hand pump;Position options  Lactation Tools Discussed/Used WIC Program: No Pump Review: Setup,  frequency, and cleaning;Milk Storage Initiated by:: by Nurse. Date initiated:: 07/26/18   Consult Status Consult Status: Follow-up Date: 07/26/18 Follow-up type: In-patient    Vicente Serene 07/26/2018, 6:55 AM

## 2018-07-26 NOTE — Progress Notes (Signed)
Patient is eating, ambulating, voiding.  Pain control is good.  Appropriate lochia, no complaints.  Vitals:   07/25/18 1920 07/25/18 2350 07/26/18 0333 07/26/18 0740  BP: (!) 145/82 133/83 122/69   Pulse: 70 75 75 79  Resp: 16 18 16 16   Temp: 99.1 F (37.3 C) 98.9 F (37.2 C) 99.1 F (37.3 C) 98.3 F (36.8 C)  TempSrc: Oral Oral Oral Oral  SpO2: 96% 96% 98%   Weight:      Height:        Fundus firm Abd: soft, nontender Ext: no calf tenderness  Lab Results  Component Value Date   WBC 12.5 (H) 07/26/2018   HGB 8.8 (L) 07/26/2018   HCT 28.5 (L) 07/26/2018   MCV 85.8 07/26/2018   PLT 159 07/26/2018    --/--/A POS, A POS Performed at [facility], [address] [place], [place] [address]  (01/24 0050)  A/P Post op day #1 s/p c/s for NRFHT Anemia- FeSO4 added bid  Routine care.  Expect d/c 1/26    Allyn Kenner

## 2018-07-26 NOTE — Anesthesia Postprocedure Evaluation (Signed)
Anesthesia Post Note  Patient: [name]  Procedure(s) Performed: CESAREAN SECTION (N/A )     Patient location during evaluation: Mother Baby Anesthesia Type: Epidural Level of consciousness: awake Pain management: satisfactory to patient Vital Signs Assessment: post-procedure vital signs reviewed and stable Respiratory status: spontaneous breathing Cardiovascular status: stable Anesthetic complications: no    Last Vitals:  Vitals:   07/26/18 0333 07/26/18 0740  BP: 122/69   Pulse: 75 79  Resp: 16 16  Temp: 37.3 C 36.8 C  SpO2: 98%     Last Pain:  Vitals:   07/26/18 0740  TempSrc: Oral  PainSc: 0-No pain   Pain Goal: Patients Stated Pain Goal: 3 (07/25/18 1815)                 Casimer Lanius

## 2018-07-27 ENCOUNTER — Encounter (HOSPITAL_COMMUNITY): Payer: Self-pay

## 2018-07-27 NOTE — Progress Notes (Signed)
Went into room to check in with family.  Baby crying in crib, mom and dad looking perplexed.  Stated baby seemed frustrated because she couldn't suck on her hand because they covered her hands so she wouldn't scratch herself.  This nurse then asked if baby had sucked her hand in the womb; they replied yes, in every ultrasound they had.  Also explained that babies' hands smell like "home"/amniotic fluid and  It could be comforting to her.  Discussed the abrupt change babies experience in their environment when they are born--gravity, clothes, motionless crib--and that sucking often can provide them with comfort.  Unwrapped baby's hand so baby could place near to face; baby fell alseep.  Also pointed out how she seemed to like light stroking of her head; dad also offered up something they had tried earlier that helped calm her down!  This nurse suggested they could cover her hand back up when she had fallen asleep, and that colostrum/breastmilk was an excellent choice to put on small scratches and scraps.  When nurrse left room, parents appeared less anxious, and baby was still asleep.

## 2018-07-27 NOTE — Progress Notes (Signed)
Patient is eating, ambulating, voiding.  Pain control is good.  Appropriate lochia, no complaints.  Desires to stay an additional day.  Vitals:   07/26/18 0740 07/26/18 1457 07/26/18 2136 07/27/18 0614  BP:  (!) 143/93 122/79 119/78  Pulse: 79 78 87 70  Resp: 16 18 18 18   Temp: 98.3 F (36.8 C) 98.3 F (36.8 C) 98.4 F (36.9 C) 98.1 F (36.7 C)  TempSrc: Oral Oral Oral   SpO2:    95%  Weight:      Height:        Fundus firm Abd: non tender Inc: c/d/i Ext: no calf tenderness  Lab Results  Component Value Date   WBC 12.5 (H) 07/26/2018   HGB 8.8 (L) 07/26/2018   HCT 28.5 (L) 07/26/2018   MCV 85.8 07/26/2018   PLT 159 07/26/2018    --/--/A POS, A POS Performed at [facility], [address] [place], [place] [address]  (01/24 0050)  A/P Post op day #2 s/p c/s for NRFHT remote from delivery. Anemia- PNV with daily Fe  Routine care.  Expect d/c 1/27.    [name]

## 2018-07-27 NOTE — Lactation Note (Signed)
This note was copied from a baby's chart. Lactation Consultation Note  Patient Name: [name] [id number] Date: 07/27/2018 Reason for consult: Follow-up assessment;Primapara;1st time breastfeeding;Mother's request;Difficult latch;Term  Visited with P1 Mom of term baby at 57 hrs.  Mom requesting assistance latching baby to the left breast, as baby has been latching well, but only on her right side. Breast are filling, not engorged.  Breast massage and hand expression reviewed, colostrum easily expressed.  Right nipple abraded on tip, no blistering or cracks visible.  Mom using colostrum and coconut oil after feedings.   Reviewed positioning with pillow support.  Baby positioned in football hold.  Using hand pump to pre-pump to help evert nipple some and prime the breast.  Milk easily expressed.  Baby opened wide and assisted Mom to bring baby quickly onto breast.  Baby noted to become sleepy after about 5 mins, but continued to stay latched and suck.  A few swallows identified.  Mom assisted to use alternate breast compression to increase milk transfer.   Mom feels a lot better now that baby is latched to left side.  Encouraged Mom to initiate next few feedings on the left breast, asking for help prn.  If baby has trouble at next feeding, to ask for help, and may need to pump that breast.    Interventions Interventions: Breast feeding basics reviewed;Assisted with latch;Skin to skin;Breast massage;Hand express;Support pillows;Adjust position;Breast compression;Pre-pump if needed;Position options;Expressed milk;Shells;Hand pump  Lactation Tools Discussed/Used Tools: Shells;Pump Shell Type: Inverted Breast pump type: Manual   Consult Status Consult Status: Follow-up Date: 07/28/18 Follow-up type: In-patient    Broadus John 07/27/2018, 11:48 AM

## 2018-07-28 MED ORDER — OXYCODONE-ACETAMINOPHEN 5-325 MG PO TABS: 1.0000 | ORAL_TABLET | Freq: Four times a day (QID) | ORAL | 0 refills | Status: DC | PRN

## 2018-07-28 MED ORDER — IBUPROFEN 600 MG PO TABS: 600.0000 mg | ORAL_TABLET | Freq: Four times a day (QID) | ORAL | 0 refills | Status: DC | PRN

## 2018-07-28 MED ORDER — MEASLES, MUMPS & RUBELLA VAC IJ SOLR
0.5000 mL | Freq: Once | INTRAMUSCULAR | Status: DC
  Filled 2018-07-28: qty 0.5

## 2018-07-28 NOTE — Lactation Note (Signed)
This note was copied from a baby's chart. Lactation Consultation Note  Patient Name: [name] [id number] Date: 07/28/2018 Reason for consult: Follow-up assessment;Primapara;1st time breastfeeding;Term  P1 mother whose infant is now 35 hours old.  Mother was pumping her left breast when I arrived.  She still has difficulty latching to the left side but is feeling more encouraged now.  She stated that she will work more with baby on the left breast after she recovers more from her Cesarean section.  I offered to assist with cross cradle to obtain a more comfortable position but mother politely declined.  She is obtaining a good milk supply from both breasts.  Due to her increased milk supply mother has been leaking from her breasts.  Nursing pads provided.    Engorgement prevention/treatment discussed.  Mother has a DEBP for home use and has our OP phone number for questions after discharge.  Informed her of our support groups and our OP lactation services if needed.  Father present and supportive.     Maternal Data Formula Feeding for Exclusion: No Has patient been taught Hand Expression?: Yes Does the patient have breastfeeding experience prior to this delivery?: No  Feeding Feeding Type: Breast Fed  LATCH Score Latch: Grasps breast easily, tongue down, lips flanged, rhythmical sucking.  Audible Swallowing: A few with stimulation  Type of Nipple: Everted at rest and after stimulation  Comfort (Breast/Nipple): Filling, red/small blisters or bruises, mild/mod discomfort  Hold (Positioning): No assistance needed to correctly position infant at breast.  LATCH Score: 8  Interventions    Lactation Tools Discussed/Used WIC Program: No Initiated by:: Already initiated   Consult Status Consult Status: Complete Date: 07/28/18 Follow-up type: Call as needed    Paula Busenbark R Darely Becknell 07/28/2018, 8:33 AM

## 2018-08-01 ENCOUNTER — Inpatient Hospital Stay (HOSPITAL_COMMUNITY): Admit: 2018-08-01 | Payer: 59 | Admitting: Obstetrics and Gynecology

## 2018-08-05 DIAGNOSIS — L299 Pruritus, unspecified: Secondary | ICD-10-CM | POA: Diagnosis not present

## 2018-08-19 NOTE — Discharge Summary (Signed)
Obstetric Discharge Summary Reason for Admission: induction of labor Prenatal Procedures: NST and ultrasound Intrapartum Procedures: cesarean: low cervical, transverse Postpartum Procedures: none Complications-Operative and Postpartum: none Hemoglobin  Date Value Ref Range Status  07/26/2018 8.8 (L) 12.0 - 15.0 g/dL Final   HCT  Date Value Ref Range Status  07/26/2018 28.5 (L) 36.0 - 46.0 % Final    Physical Exam:  General: alert, cooperative and appears stated age 33: appropriate Uterine Fundus: firm Incision: healing well DVT Evaluation: No evidence of DVT seen on physical exam.  Discharge Diagnoses: Term Pregnancy-delivered  Discharge Information: Date: 08/19/2018 Activity: pelvic rest Diet: routine Medications: Ibuprofen, Colace and Percocet Condition: improved Instructions: refer to practice specific booklet Discharge to: home   Newborn Data: Live born female  Birth Weight: 6 lb 7 oz (2920 g) APGAR: 75, 9  Newborn Delivery   Birth date/time:  07/[birth year]/2018 13:45:00 Delivery type:  C-Section, Low Transverse Trial of labor:  Yes C-section categorization:  Primary     Home with mother.  Vanessa Kick 08/19/2018, 11:44 AM

## 2018-08-22 DIAGNOSIS — Z124 Encounter for screening for malignant neoplasm of cervix: Secondary | ICD-10-CM | POA: Diagnosis not present

## 2018-11-07 ENCOUNTER — Encounter (HOSPITAL_COMMUNITY)
Admission: EM | Disposition: A | Discharge status: Home / Self Care | Payer: Self-pay | Source: Home / Self Care | Attending: Emergency Medicine

## 2018-11-07 ENCOUNTER — Observation Stay (HOSPITAL_COMMUNITY)
Admission: EM | Admit: 2018-11-07 | Discharge: 2018-11-08 | Disposition: A | Discharge status: Home / Self Care | Payer: 59 | Attending: Surgery | Admitting: Surgery

## 2018-11-07 ENCOUNTER — Encounter (HOSPITAL_COMMUNITY): Payer: Self-pay

## 2018-11-07 ENCOUNTER — Emergency Department (HOSPITAL_COMMUNITY): Payer: 59

## 2018-11-07 ENCOUNTER — Observation Stay (HOSPITAL_COMMUNITY): Payer: 59 | Admitting: Certified Registered Nurse Anesthetist

## 2018-11-07 ENCOUNTER — Other Ambulatory Visit: Payer: Self-pay

## 2018-11-07 DIAGNOSIS — K37 Unspecified appendicitis: Secondary | ICD-10-CM | POA: Diagnosis not present

## 2018-11-07 DIAGNOSIS — K3580 Unspecified acute appendicitis: Principal | ICD-10-CM | POA: Insufficient documentation

## 2018-11-07 DIAGNOSIS — R1031 Right lower quadrant pain: Secondary | ICD-10-CM | POA: Diagnosis not present

## 2018-11-07 DIAGNOSIS — Z1159 Encounter for screening for other viral diseases: Secondary | ICD-10-CM | POA: Diagnosis not present

## 2018-11-07 DIAGNOSIS — Z03818 Encounter for observation for suspected exposure to other biological agents ruled out: Secondary | ICD-10-CM | POA: Diagnosis not present

## 2018-11-07 HISTORY — PX: LAPAROSCOPIC APPENDECTOMY: SHX408

## 2018-11-07 HISTORY — DX: Unspecified appendicitis: K37

## 2018-11-07 HISTORY — DX: Unspecified acute appendicitis: K35.80

## 2018-11-07 LAB — CBC WITH DIFFERENTIAL/PLATELET
Abs Immature Granulocytes: 0.02 10*3/uL (ref 0.00–0.07)
Basophils Absolute: 0.1 10*3/uL (ref 0.0–0.1)
Basophils Relative: 1 %
Eosinophils Absolute: 0.2 10*3/uL (ref 0.0–0.5)
Eosinophils Relative: 3 %
HCT: 45.1 % (ref 36.0–46.0)
Hemoglobin: 14.7 g/dL (ref 12.0–15.0)
Immature Granulocytes: 0 %
Lymphocytes Relative: 27 %
Lymphs Abs: 1.9 10*3/uL (ref 0.7–4.0)
MCH: 28.7 pg (ref 26.0–34.0)
MCHC: 32.6 g/dL (ref 30.0–36.0)
MCV: 87.9 fL (ref 80.0–100.0)
Monocytes Absolute: 0.4 10*3/uL (ref 0.1–1.0)
Monocytes Relative: 5 %
Neutro Abs: 4.5 10*3/uL (ref 1.7–7.7)
Neutrophils Relative %: 64 %
Platelets: 210 10*3/uL (ref 150–400)
RBC: 5.13 MIL/uL — ABNORMAL HIGH (ref 3.87–5.11)
RDW: 13.9 % (ref 11.5–15.5)
WBC: 7 10*3/uL (ref 4.0–10.5)
nRBC: 0 % (ref 0.0–0.2)

## 2018-11-07 LAB — COMPREHENSIVE METABOLIC PANEL
ALT: 22 U/L (ref 0–44)
AST: 18 U/L (ref 15–41)
Albumin: 3.9 g/dL (ref 3.5–5.0)
Alkaline Phosphatase: 79 U/L (ref 38–126)
Anion gap: 14 (ref 5–15)
BUN: 11 mg/dL (ref 6–20)
CO2: 21 mmol/L — ABNORMAL LOW (ref 22–32)
Calcium: 9.4 mg/dL (ref 8.9–10.3)
Chloride: 106 mmol/L (ref 98–111)
Creatinine, Ser: 0.76 mg/dL (ref 0.44–1.00)
GFR calc Af Amer: >60 mL/min (ref >60)
GFR calc non Af Amer: >60 mL/min (ref >60)
Glucose, Bld: 90 mg/dL (ref 70–99)
Potassium: 4 mmol/L (ref 3.5–5.1)
Sodium: 141 mmol/L (ref 135–145)
Total Bilirubin: 0.5 mg/dL (ref 0.3–1.2)
Total Protein: 7.5 g/dL (ref 6.5–8.1)

## 2018-11-07 LAB — WET PREP, GENITAL
Clue Cells Wet Prep HPF POC: NONE SEEN
Sperm: NONE SEEN
Trich, Wet Prep: NONE SEEN
Yeast Wet Prep HPF POC: NONE SEEN

## 2018-11-07 LAB — SARS CORONAVIRUS 2 BY RT PCR (HOSPITAL ORDER, PERFORMED IN ~~LOC~~ HOSPITAL LAB): SARS Coronavirus 2: NEGATIVE

## 2018-11-07 LAB — I-STAT BETA HCG BLOOD, ED (MC, WL, AP ONLY): I-stat hCG, quantitative: <5 m[IU]/mL (ref <5)

## 2018-11-07 LAB — LIPASE, BLOOD: Lipase: 27 U/L (ref 11–51)

## 2018-11-07 SURGERY — APPENDECTOMY, LAPAROSCOPIC
Anesthesia: General | Site: Abdomen

## 2018-11-07 MED ORDER — DEXAMETHASONE SODIUM PHOSPHATE 10 MG/ML IJ SOLN
INTRAMUSCULAR | Status: AC
  Filled 2018-11-07: qty 1

## 2018-11-07 MED ORDER — DIPHENHYDRAMINE HCL 50 MG/ML IJ SOLN
INTRAMUSCULAR | Status: DC | PRN
  Administered 2018-11-07: 12.5 mg via INTRAVENOUS

## 2018-11-07 MED ORDER — DEXAMETHASONE SODIUM PHOSPHATE 10 MG/ML IJ SOLN
INTRAMUSCULAR | Status: DC | PRN
  Administered 2018-11-07: 5 mg via INTRAVENOUS

## 2018-11-07 MED ORDER — SODIUM CHLORIDE 0.9 % IV SOLN
INTRAVENOUS | Status: DC
  Administered 2018-11-07 – 2018-11-08 (×2): via INTRAVENOUS

## 2018-11-07 MED ORDER — SCOPOLAMINE 1 MG/3DAYS TD PT72
MEDICATED_PATCH | TRANSDERMAL | Status: AC
  Filled 2018-11-07: qty 1

## 2018-11-07 MED ORDER — ONDANSETRON HCL 4 MG/2ML IJ SOLN: 4.0000 mg | Freq: Four times a day (QID) | INTRAMUSCULAR | Status: DC | PRN

## 2018-11-07 MED ORDER — ACETAMINOPHEN 500 MG PO TABS: 1000.0000 mg | ORAL_TABLET | Freq: Once | ORAL | Status: DC | PRN

## 2018-11-07 MED ORDER — DIPHENHYDRAMINE HCL 25 MG PO CAPS: 25.0000 mg | ORAL_CAPSULE | Freq: Four times a day (QID) | ORAL | Status: DC | PRN

## 2018-11-07 MED ORDER — ROCURONIUM BROMIDE 10 MG/ML (PF) SYRINGE
PREFILLED_SYRINGE | INTRAVENOUS | Status: AC
  Filled 2018-11-07: qty 10

## 2018-11-07 MED ORDER — SUCCINYLCHOLINE CHLORIDE 200 MG/10ML IV SOSY
PREFILLED_SYRINGE | INTRAVENOUS | Status: AC
  Filled 2018-11-07: qty 10

## 2018-11-07 MED ORDER — ONDANSETRON HCL 4 MG/2ML IJ SOLN
INTRAMUSCULAR | Status: AC
  Filled 2018-11-07: qty 2

## 2018-11-07 MED ORDER — METHOCARBAMOL 500 MG PO TABS: 500.0000 mg | ORAL_TABLET | Freq: Four times a day (QID) | ORAL | Status: DC | PRN

## 2018-11-07 MED ORDER — DIPHENHYDRAMINE HCL 50 MG/ML IJ SOLN: 25.0000 mg | Freq: Four times a day (QID) | INTRAMUSCULAR | Status: DC | PRN

## 2018-11-07 MED ORDER — ACETAMINOPHEN 325 MG PO TABS
650.0000 mg | ORAL_TABLET | Freq: Four times a day (QID) | ORAL | Status: DC | PRN
  Administered 2018-11-07 – 2018-11-08 (×2): 650 mg via ORAL
  Filled 2018-11-07 (×2): qty 2

## 2018-11-07 MED ORDER — FENTANYL CITRATE (PF) 100 MCG/2ML IJ SOLN
25.0000 ug | INTRAMUSCULAR | Status: DC | PRN
  Administered 2018-11-07: 50 ug via INTRAVENOUS

## 2018-11-07 MED ORDER — ALUM & MAG HYDROXIDE-SIMETH 200-200-20 MG/5ML PO SUSP
30.0000 mL | Freq: Four times a day (QID) | ORAL | Status: DC | PRN
  Administered 2018-11-07: 30 mL via ORAL
  Filled 2018-11-07: qty 30

## 2018-11-07 MED ORDER — ADULT MULTIVITAMIN W/MINERALS CH
1.0000 | ORAL_TABLET | Freq: Every day | ORAL | Status: DC
  Administered 2018-11-08: 1 via ORAL
  Filled 2018-11-07: qty 1

## 2018-11-07 MED ORDER — ENOXAPARIN SODIUM 40 MG/0.4ML ~~LOC~~ SOLN
40.0000 mg | SUBCUTANEOUS | Status: DC
  Filled 2018-11-07: qty 0.4

## 2018-11-07 MED ORDER — PROPOFOL 10 MG/ML IV BOLUS
INTRAVENOUS | Status: AC
  Filled 2018-11-07: qty 20

## 2018-11-07 MED ORDER — PROPOFOL 10 MG/ML IV BOLUS
INTRAVENOUS | Status: DC | PRN
  Administered 2018-11-07: 140 mg via INTRAVENOUS

## 2018-11-07 MED ORDER — ACETAMINOPHEN 650 MG RE SUPP: 650.0000 mg | Freq: Four times a day (QID) | RECTAL | Status: DC | PRN

## 2018-11-07 MED ORDER — ACETAMINOPHEN 10 MG/ML IV SOLN
INTRAVENOUS | Status: AC
  Filled 2018-11-07: qty 100

## 2018-11-07 MED ORDER — OXYCODONE HCL 5 MG PO TABS: 5.0000 mg | ORAL_TABLET | Freq: Once | ORAL | Status: DC | PRN

## 2018-11-07 MED ORDER — DIPHENHYDRAMINE HCL 50 MG/ML IJ SOLN
INTRAMUSCULAR | Status: AC
  Filled 2018-11-07: qty 1

## 2018-11-07 MED ORDER — OXYCODONE-ACETAMINOPHEN 5-325 MG PO TABS: 1.0000 | ORAL_TABLET | ORAL | Status: DC | PRN

## 2018-11-07 MED ORDER — SODIUM CHLORIDE 0.9 % IV BOLUS
1000.0000 mL | Freq: Once | INTRAVENOUS | Status: AC
  Administered 2018-11-07: 1000 mL via INTRAVENOUS

## 2018-11-07 MED ORDER — ONDANSETRON 4 MG PO TBDP: 4.0000 mg | ORAL_TABLET | Freq: Four times a day (QID) | ORAL | Status: DC | PRN

## 2018-11-07 MED ORDER — FENTANYL CITRATE (PF) 250 MCG/5ML IJ SOLN
INTRAMUSCULAR | Status: DC | PRN
  Administered 2018-11-07: 150 ug via INTRAVENOUS

## 2018-11-07 MED ORDER — MIDAZOLAM HCL 2 MG/2ML IJ SOLN
INTRAMUSCULAR | Status: AC
  Filled 2018-11-07: qty 2

## 2018-11-07 MED ORDER — SIMETHICONE 80 MG PO CHEW
40.0000 mg | CHEWABLE_TABLET | Freq: Four times a day (QID) | ORAL | Status: DC | PRN
  Administered 2018-11-08: 40 mg via ORAL
  Filled 2018-11-07: qty 1

## 2018-11-07 MED ORDER — SCOPOLAMINE 1 MG/3DAYS TD PT72
MEDICATED_PATCH | TRANSDERMAL | Status: DC | PRN
  Administered 2018-11-07: 1 via TRANSDERMAL

## 2018-11-07 MED ORDER — FENTANYL CITRATE (PF) 100 MCG/2ML IJ SOLN
INTRAMUSCULAR | Status: AC
  Filled 2018-11-07: qty 2

## 2018-11-07 MED ORDER — ACETAMINOPHEN 160 MG/5ML PO SOLN: 1000.0000 mg | Freq: Once | ORAL | Status: DC | PRN

## 2018-11-07 MED ORDER — ROCURONIUM BROMIDE 50 MG/5ML IV SOSY
PREFILLED_SYRINGE | INTRAVENOUS | Status: DC | PRN
  Administered 2018-11-07: 30 mg via INTRAVENOUS

## 2018-11-07 MED ORDER — ONDANSETRON HCL 4 MG/2ML IJ SOLN
INTRAMUSCULAR | Status: DC | PRN
  Administered 2018-11-07: 4 mg via INTRAVENOUS

## 2018-11-07 MED ORDER — LIDOCAINE 2% (20 MG/ML) 5 ML SYRINGE
INTRAMUSCULAR | Status: DC | PRN
  Administered 2018-11-07: 60 mg via INTRAVENOUS

## 2018-11-07 MED ORDER — SODIUM CHLORIDE 0.9 % IR SOLN
Status: DC | PRN
  Administered 2018-11-07: 1000 mL

## 2018-11-07 MED ORDER — DOCUSATE SODIUM 100 MG PO CAPS
100.0000 mg | ORAL_CAPSULE | Freq: Two times a day (BID) | ORAL | Status: DC
  Administered 2018-11-08: 09:00:00 100 mg via ORAL
  Filled 2018-11-07: qty 1

## 2018-11-07 MED ORDER — OXYCODONE HCL 5 MG PO TABS: 5.0000 mg | ORAL_TABLET | ORAL | Status: DC | PRN

## 2018-11-07 MED ORDER — LIDOCAINE 2% (20 MG/ML) 5 ML SYRINGE
INTRAMUSCULAR | Status: AC
  Filled 2018-11-07: qty 5

## 2018-11-07 MED ORDER — BUPIVACAINE-EPINEPHRINE (PF) 0.5% -1:200000 IJ SOLN
INTRAMUSCULAR | Status: AC
  Filled 2018-11-07: qty 30

## 2018-11-07 MED ORDER — FENTANYL CITRATE (PF) 250 MCG/5ML IJ SOLN
INTRAMUSCULAR | Status: AC
  Filled 2018-11-07: qty 5

## 2018-11-07 MED ORDER — SODIUM CHLORIDE 0.9 % IV SOLN
2.0000 g | Freq: Once | INTRAVENOUS | Status: AC
  Administered 2018-11-07: 2 g via INTRAVENOUS
  Filled 2018-11-07: qty 20

## 2018-11-07 MED ORDER — IOHEXOL 300 MG/ML  SOLN
100.0000 mL | Freq: Once | INTRAMUSCULAR | Status: AC | PRN
  Administered 2018-11-07: 100 mL via INTRAVENOUS

## 2018-11-07 MED ORDER — SUGAMMADEX SODIUM 200 MG/2ML IV SOLN
INTRAVENOUS | Status: DC | PRN
  Administered 2018-11-07: 159.6 mg via INTRAVENOUS

## 2018-11-07 MED ORDER — MIDAZOLAM HCL 2 MG/2ML IJ SOLN
INTRAMUSCULAR | Status: DC | PRN
  Administered 2018-11-07: 2 mg via INTRAVENOUS

## 2018-11-07 MED ORDER — LACTATED RINGERS IV SOLN
INTRAVENOUS | Status: DC | PRN
  Administered 2018-11-07: 16:00:00 via INTRAVENOUS

## 2018-11-07 MED ORDER — METRONIDAZOLE IN NACL 5-0.79 MG/ML-% IV SOLN
500.0000 mg | Freq: Once | INTRAVENOUS | Status: AC
  Administered 2018-11-07: 500 mg via INTRAVENOUS
  Filled 2018-11-07: qty 100

## 2018-11-07 MED ORDER — SUCCINYLCHOLINE CHLORIDE 200 MG/10ML IV SOSY
PREFILLED_SYRINGE | INTRAVENOUS | Status: DC | PRN
  Administered 2018-11-07: 70 mg via INTRAVENOUS

## 2018-11-07 MED ORDER — HYDRALAZINE HCL 20 MG/ML IJ SOLN: 10.0000 mg | INTRAMUSCULAR | Status: DC | PRN

## 2018-11-07 MED ORDER — BUPIVACAINE-EPINEPHRINE 0.5% -1:200000 IJ SOLN
INTRAMUSCULAR | Status: DC | PRN
  Administered 2018-11-07: 5 mL

## 2018-11-07 MED ORDER — MORPHINE SULFATE (PF) 2 MG/ML IV SOLN: 2.0000 mg | INTRAVENOUS | Status: DC | PRN

## 2018-11-07 MED ORDER — POLYETHYLENE GLYCOL 3350 17 G PO PACK: 17.0000 g | PACK | Freq: Every day | ORAL | Status: DC | PRN

## 2018-11-07 MED ORDER — 0.9 % SODIUM CHLORIDE (POUR BTL) OPTIME
TOPICAL | Status: DC | PRN
  Administered 2018-11-07: 1000 mL

## 2018-11-07 MED ORDER — ACETAMINOPHEN 10 MG/ML IV SOLN
1000.0000 mg | Freq: Once | INTRAVENOUS | Status: DC | PRN
  Administered 2018-11-07: 1000 mg via INTRAVENOUS

## 2018-11-07 MED ORDER — OXYCODONE HCL 5 MG/5ML PO SOLN: 5.0000 mg | Freq: Once | ORAL | Status: DC | PRN

## 2018-11-07 SURGICAL SUPPLY — 42 items
APPLIER CLIP ROT 10 11.4 M/L (STAPLE)
BLADE CLIPPER SURG (BLADE) IMPLANT
CANISTER SUCT 3000ML PPV (MISCELLANEOUS) ×2 IMPLANT
CHLORAPREP W/TINT 26ML (MISCELLANEOUS) ×2 IMPLANT
CLIP APPLIE ROT 10 11.4 M/L (STAPLE) IMPLANT
COVER SURGICAL LIGHT HANDLE (MISCELLANEOUS) ×2 IMPLANT
COVER WAND RF STERILE (DRAPES) ×2 IMPLANT
CUTTER FLEX LINEAR 45M (STAPLE) ×2 IMPLANT
DERMABOND ADVANCED (GAUZE/BANDAGES/DRESSINGS) ×1
DERMABOND ADVANCED .7 DNX12 (GAUZE/BANDAGES/DRESSINGS) ×1 IMPLANT
DRAPE WARM FLUID 44X44 (DRAPE) ×2 IMPLANT
ELECT REM PT RETURN 9FT ADLT (ELECTROSURGICAL) ×2
ELECTRODE REM PT RTRN 9FT ADLT (ELECTROSURGICAL) ×1 IMPLANT
ENDOLOOP SUT PDS II  0 18 (SUTURE)
ENDOLOOP SUT PDS II 0 18 (SUTURE) IMPLANT
GLOVE BIO SURGEON STRL SZ8 (GLOVE) ×2 IMPLANT
GLOVE BIOGEL PI IND STRL 8 (GLOVE) ×1 IMPLANT
GLOVE BIOGEL PI INDICATOR 8 (GLOVE) ×1
GOWN STRL REUS W/ TWL LRG LVL3 (GOWN DISPOSABLE) ×2 IMPLANT
GOWN STRL REUS W/ TWL XL LVL3 (GOWN DISPOSABLE) ×1 IMPLANT
GOWN STRL REUS W/TWL LRG LVL3 (GOWN DISPOSABLE) ×2
GOWN STRL REUS W/TWL XL LVL3 (GOWN DISPOSABLE) ×1
KIT BASIN OR (CUSTOM PROCEDURE TRAY) ×2 IMPLANT
KIT TURNOVER KIT B (KITS) ×2 IMPLANT
NS IRRIG 1000ML POUR BTL (IV SOLUTION) ×2 IMPLANT
PAD ARMBOARD 7.5X6 YLW CONV (MISCELLANEOUS) ×4 IMPLANT
POUCH RETRIEVAL ECOSAC 10 (ENDOMECHANICALS) ×1 IMPLANT
POUCH RETRIEVAL ECOSAC 10MM (ENDOMECHANICALS) ×1
RELOAD STAPLE TA45 3.5 REG BLU (ENDOMECHANICALS) ×2 IMPLANT
SCISSORS LAP 5X35 DISP (ENDOMECHANICALS) ×2 IMPLANT
SET IRRIG TUBING LAPAROSCOPIC (IRRIGATION / IRRIGATOR) ×2 IMPLANT
SET TUBE SMOKE EVAC HIGH FLOW (TUBING) ×2 IMPLANT
SHEARS HARMONIC ACE PLUS 36CM (ENDOMECHANICALS) ×2 IMPLANT
SPECIMEN JAR SMALL (MISCELLANEOUS) ×2 IMPLANT
SUT MON AB 4-0 PC3 18 (SUTURE) ×2 IMPLANT
TOWEL OR 17X24 6PK STRL BLUE (TOWEL DISPOSABLE) ×2 IMPLANT
TOWEL OR 17X26 10 PK STRL BLUE (TOWEL DISPOSABLE) ×2 IMPLANT
TRAY FOLEY CATH SILVER 16FR (SET/KITS/TRAYS/PACK) ×2 IMPLANT
TRAY LAPAROSCOPIC MC (CUSTOM PROCEDURE TRAY) ×2 IMPLANT
TROCAR XCEL BLADELESS 5X75MML (TROCAR) ×4 IMPLANT
TROCAR XCEL BLUNT TIP 100MML (ENDOMECHANICALS) ×2 IMPLANT
WATER STERILE IRR 1000ML POUR (IV SOLUTION) ×2 IMPLANT

## 2018-11-07 NOTE — ED Provider Notes (Signed)
[facility] Provider Note   CSN: [id number] Arrival date & time: 11/07/18  1121    History   Chief Complaint Chief Complaint  Patient presents with  . Abdominal Pain  . Nausea    HPI [name] is a 33 y.o. female with a hx of GERD & prior C-section who presents to the ED from [facility] for evaluation of abdominal pain that began yesterday AM. Patient states she woke up feeling somewhat nauseated & developed somewhat generalized abdominal pain that seemed to localize to the RLQ. She states pain has been waxing/waning. It is currently a 3/10 in severity but worse if she moves/coughs, no alleviating factors. Tried OTC anti-acids & gas-x without much change. Spoke with telemedicine who recommended UC, seen at UC at sent to the ED for evaluation. Denies fever, chills, emesis, diarrhea, melena, hematochezia, dysuria, vaginal bleeding, or vaginal discharge. LMP was prior to pregnancy. No concern for STD. Other than C-section no other prior abdominal surgeries. She is currently breast feeding. Last PO intake was around 10AM this morning- coffee w/ creamer, has not had anything else to eat/drink today.     HPI  Past Medical History:  Diagnosis Date  . GERD (gastroesophageal reflux disease)     Patient Active Problem List   Diagnosis Date Noted  . Pregnancy 07/25/2018    Past Surgical History:  Procedure Laterality Date  . CESAREAN SECTION N/A 07/25/2018   Procedure: CESAREAN SECTION;  Surgeon: Allyn Kenner, DO;  Location: [place];  Service: Obstetrics;  Laterality: N/A;  . TONSILLECTOMY    . WISDOM TOOTH EXTRACTION       OB History    Gravida  1   Para  1   Term  1   Preterm      AB      Living  1     SAB      TAB      Ectopic      Multiple  0   Live Births  1            Home Medications    Prior to Admission medications   Medication Sig Start Date End Date Taking? Authorizing Provider  ibuprofen  (ADVIL,MOTRIN) 600 MG tablet Take 1 tablet (600 mg total) by mouth every 6 (six) hours as needed. 07/28/18   Vanessa Kick, MD  oxyCODONE-acetaminophen (PERCOCET/ROXICET) 5-325 MG tablet Take 1-2 tablets by mouth every 6 (six) hours as needed for severe pain. 07/28/18   Vanessa Kick, MD    Family History History reviewed. No pertinent family history.  Social History Social History   Tobacco Use  . Smoking status: Never Smoker  . Smokeless tobacco: Never Used  Substance Use Topics  . Alcohol use: Not Currently    Comment: prior to preg  . Drug use: Never     Allergies   Patient has no known allergies.   Review of Systems Review of Systems  Constitutional: Negative for chills and fever.  Respiratory: Negative for shortness of breath.   Cardiovascular: Negative for chest pain.  Gastrointestinal: Positive for abdominal pain and nausea. Negative for blood in stool, constipation, diarrhea and vomiting.  Genitourinary: Negative for dysuria, vaginal bleeding and vaginal discharge.  All other systems reviewed and are negative.  Physical Exam Updated Vital Signs BP (!) 127/95 (BP Location: Right Arm)   Pulse 96   Temp 99.1 F (37.3 C) (Oral)   Resp 20   Ht 5\' 7"  (1.702 m)  Wt 79.8 kg   SpO2 100%   BMI 27.57 kg/m   Physical Exam Vitals signs and nursing note reviewed. Exam conducted with a chaperone present.  Constitutional:      General: She is not in acute distress.    Appearance: She is well-developed. She is not toxic-appearing.  HENT:     Head: Normocephalic and atraumatic.  Eyes:     General:        Right eye: No discharge.        Left eye: No discharge.     Conjunctiva/sclera: Conjunctivae normal.  Neck:     Musculoskeletal: Neck supple.  Cardiovascular:     Rate and Rhythm: Normal rate and regular rhythm.  Pulmonary:     Effort: Pulmonary effort is normal. No respiratory distress.     Breath sounds: Normal breath sounds. No wheezing, rhonchi or rales.   Abdominal:     General: There is no distension.     Palpations: Abdomen is soft.     Tenderness: There is abdominal tenderness in the right lower quadrant. There is no right CVA tenderness, left CVA tenderness, guarding or rebound. Positive signs include Rovsing's sign and McBurney's sign. Negative signs include Murphy's sign, psoas sign and obturator sign.  Genitourinary:    Labia:        Right: No lesion.        Left: No lesion.      Vagina: Vaginal discharge (minimal white) present.     Cervix: No cervical motion tenderness.     Adnexa:        Right: No mass, tenderness or fullness.         Left: No mass, tenderness or fullness.       Comments: RN Greta Doom present as chaperone.  Skin:    General: Skin is warm and dry.     Findings: No rash.  Neurological:     Mental Status: She is alert.     Comments: Clear speech.   Psychiatric:        Behavior: Behavior normal.    ED Treatments / Results  Labs (all labs ordered are listed, but only abnormal results are displayed) Labs Reviewed  WET PREP, GENITAL  CBC WITH DIFFERENTIAL/PLATELET  COMPREHENSIVE METABOLIC PANEL  LIPASE, BLOOD  URINALYSIS, ROUTINE W REFLEX MICROSCOPIC  I-STAT BETA HCG BLOOD, ED ([facility], [facility], [facility] ONLY)  GC/CHLAMYDIA PROBE AMP ([place]) NOT AT [facility]    EKG None  Radiology Ct Abdomen Pelvis W Contrast  Result Date: 11/07/2018 CLINICAL DATA:  Right lower quadrant abdominal pain since last night. Nausea. Cesarean delivery 3 months prior. EXAM: CT ABDOMEN AND PELVIS WITH CONTRAST TECHNIQUE: Multidetector CT imaging of the abdomen and pelvis was performed using the standard protocol following bolus administration of intravenous contrast. CONTRAST:  126mL OMNIPAQUE IOHEXOL 300 MG/ML  SOLN COMPARISON:  None. FINDINGS: Lower chest: No significant pulmonary nodules or acute consolidative airspace disease. Hepatobiliary: Normal liver size. Subcentimeter hypodense lateral segment left liver lobe lesion is too small  to characterize and requires no follow-up unless the patient has risk factors for liver malignancy. No additional liver lesions. Normal gallbladder with no radiopaque cholelithiasis. No biliary ductal dilatation. Pancreas: Normal, with no mass or duct dilation. Spleen: Normal size spleen. Small 0.9 cm cystic anterior splenic lesion is most compatible with a benign lesion such as a lymphangioma. No additional splenic lesions. Adrenals/Urinary Tract: Normal adrenals. Normal kidneys with no hydronephrosis and no renal mass. Normal bladder. Stomach/Bowel: Normal non-distended stomach. Normal  caliber small bowel with no small bowel wall thickening. Diffusely thick walled appendix with appendiceal wall hyperenhancement and periappendiceal fat stranding, compatible with acute appendicitis. Appendix: Location: Right lower quadrant Diameter: 13 mm Appendicolith: Not present Mucosal hyper-enhancement: Present Extraluminal gas: Not present Periappendiceal collection: Not present Normal large bowel with no diverticulosis, large bowel wall thickening or pericolonic fat stranding. Vascular/Lymphatic: Normal caliber abdominal aorta. Patent portal, splenic, hepatic and renal veins. No pathologically enlarged lymph nodes in the abdomen or pelvis. Reproductive: Grossly normal uterus.  No adnexal mass. Other: No pneumoperitoneum, ascites or focal fluid collection. Tiny fat containing umbilical hernia. Musculoskeletal: No aggressive appearing focal osseous lesions. IMPRESSION: Acute appendicitis. No free air. No abscess. No calcified appendicolith. Electronically Signed   By: Ilona Sorrel M.D.   On: 11/07/2018 13:43    Procedures Procedures (including critical care time)  Medications Ordered in ED Medications  cefTRIAXone (ROCEPHIN) 2 g in sodium chloride 0.9 % 100 mL IVPB (has no administration in time range)    And  metroNIDAZOLE (FLAGYL) IVPB 500 mg (has no administration in time range)  sodium chloride 0.9 % bolus 1,000  mL (1,000 mLs Intravenous New Bag/Given 11/07/18 1147)  iohexol (OMNIPAQUE) 300 MG/ML solution 100 mL (100 mLs Intravenous Contrast Given 11/07/18 1325)     Initial Impression / Assessment and Plan / ED Course  I have reviewed the triage vital signs and the nursing notes.  Pertinent labs & imaging results that were available during my care of the patient were reviewed by me and considered in my medical decision making (see chart for details).   Patient presents to the ED w/ abdominal pain. Nontoxic appearing, vitals WNL with the exception of elevated diastolic BP- doubt HTN emergency. Abdomen w/ tenderness in the RLQ, no peritoneal signs. No adnexal or cervical motion tenderness.  DDX: appendicitis, cholecystitis, pancreatitis, colitis, perf/obstruction, pyelo, nephrolithiasis, ectopic. Patient without concern for STD, no tenderness w/ bimanual exam, PID & ovarian torsion seem less likely. Plan for labs & CT abdomen/pelvis. Offered patient anti-emetics/analgesics- declined at this time.   Work-up reviewed:  Labs fairly unremarkable. No leukocytosis/anemia. LFTs, lipase, and renal function WNL. Preg test negative.  CT abdomen/pelvis consistent w/ acute appendicitis without abscess or perforation.   ABX ordered, consult placed to general surgery.   14:14: CONSULT: Discussed case w/ Alferd Apa PA-C w/ general surgery- will come to assess the patient.   14:40: Patient evaluated by general surgery team- plan to take to OR this afternoon.   Patient updated on results & plan of care, provided opportunity for questions, confirmed understanding and is in agreement.   Findings and plan of care discussed with supervising physician Dr. Sedonia Small who is in agreement.   Final Clinical Impressions(s) / ED Diagnoses   Final diagnoses:  Acute appendicitis, unspecified acute appendicitis type    ED Discharge Orders    None       Amaryllis Dyke, PA-C 11/07/18 1447    Maudie Flakes, MD  11/08/18 [id number]

## 2018-11-07 NOTE — Anesthesia Preprocedure Evaluation (Signed)
Anesthesia Evaluation  Patient identified by MRN, date of birth, ID band Patient awake    Reviewed: Allergy & Precautions, NPO status , Patient's Chart, lab work & pertinent test results  History of Anesthesia Complications Negative for: history of anesthetic complications  Airway Mallampati: II  TM Distance: >3 FB Neck ROM: Full    Dental  (+) Teeth Intact   Pulmonary neg pulmonary ROS,    breath sounds clear to auscultation       Cardiovascular negative cardio ROS   Rhythm:Regular     Neuro/Psych negative neurological ROS     GI/Hepatic Neg liver ROS, GERD  ,Acute Appendicitis   Endo/Other  negative endocrine ROS  Renal/GU negative Renal ROS     Musculoskeletal negative musculoskeletal ROS (+)   Abdominal   Peds  Hematology negative hematology ROS (+)   Anesthesia Other Findings Neg covid  Reproductive/Obstetrics                             Anesthesia Physical Anesthesia Plan  ASA: I  Anesthesia Plan: General   Post-op Pain Management:    Induction: Intravenous  PONV Risk Score and Plan: 3 and Ondansetron, Dexamethasone and Scopolamine patch - Pre-op  Airway Management Planned: Oral ETT  Additional Equipment: None  Intra-op Plan:   Post-operative Plan: Extubation in OR  Informed Consent: I have reviewed the patients History and Physical, chart, labs and discussed the procedure including the risks, benefits and alternatives for the proposed anesthesia with the patient or authorized representative who has indicated his/her understanding and acceptance.     Dental advisory given  Plan Discussed with: CRNA and Surgeon  Anesthesia Plan Comments:         Anesthesia Quick Evaluation

## 2018-11-07 NOTE — Plan of Care (Signed)
  Problem: Clinical Measurements: Goal: Postoperative complications will be avoided or minimized Outcome: Progressing   Problem: Skin Integrity: Goal: Demonstration of wound healing without infection will improve 11/07/2018 2321 by Roselind Rily, RN Outcome: Progressing 11/07/2018 2320 by Roselind Rily, RN Outcome: Progressing

## 2018-11-07 NOTE — Anesthesia Procedure Notes (Signed)
Procedure Name: Intubation Date/Time: 11/07/2018 4:23 PM Performed by: Alain Marion, CRNA Pre-anesthesia Checklist: Patient identified, Emergency Drugs available, Suction available and Patient being monitored Patient Re-evaluated:Patient Re-evaluated prior to induction Oxygen Delivery Method: Circle System Utilized Preoxygenation: Pre-oxygenation with 100% oxygen Induction Type: IV induction and Rapid sequence Laryngoscope Size: Miller and 2 Grade View: Grade I Tube type: Oral Number of attempts: 1 Airway Equipment and Method: Stylet and Oral airway Placement Confirmation: ETT inserted through vocal cords under direct vision,  positive ETCO2 and breath sounds checked- equal and bilateral Secured at: 21 cm Tube secured with: Tape Dental Injury: Teeth and Oropharynx as per pre-operative assessment

## 2018-11-07 NOTE — Interval H&P Note (Signed)
History and Physical Interval Note:  11/07/2018 3:46 PM  [name]  has presented today for surgery, with the diagnosis of Acute Appendicitis.  The various methods of treatment have been discussed with the patient and family. After consideration of risks, benefits and other options for treatment, the patient has consented to  Procedure(s): APPENDECTOMY LAPAROSCOPIC (N/A) as a surgical intervention.  The patient's history has been reviewed, patient examined, no change in status, stable for surgery.  I have reviewed the patient's chart and labs.  Questions were answered to the patient's satisfaction.     [place]

## 2018-11-07 NOTE — Progress Notes (Signed)
1845 Received pt from PACU, A&O x4. 3 lap sites to mid abd with skin glue dry and intact. Denies surgical pain at this time.

## 2018-11-07 NOTE — ED Triage Notes (Signed)
Pt was sent over from UC with c/o nausea and ABD pain since yesterday.

## 2018-11-07 NOTE — ED Notes (Signed)
BETA HCG NEGATIVE. [place]

## 2018-11-07 NOTE — H&P (Addendum)
[facility] Admission Note  [name] 12/18/1985  [id number].    Requesting MD: Dr. Gerlene Fee; Kennith Maes PA-C Chief Complaint/Reason for Consult: Acute Appendicitis   HPI:  [name] is a 33 y.o. female with a history of GERD who presented to [facility] emergency department for abdominal pain.  Patient reports on Wednesday she started noticing some epigastric discomfort that she describes as dull, bloating and feeling associated plastic gas.  She notes as the night progressed it became more generalized.  When she woke up the next morning her pain seemed to have localized to the right lower quadrant.  It began to wax/wane throughout the day but became more severe at nighttime.  She reports she does have some associated nausea but no other symptoms.  Her pain is currently 3/10 and reports it is worse with movement and palpation.  She is tried Alka-Seltzer for symptoms without any relief.  She denies any associated fever, chills, emesis, diarrhea, constipation, dysuria. Her last BM was last night and normal. No melena or hematochezia. Past abdominal surgeries include C-section in January (she is currently breastfeeding at home).  She is not any blood thinners.  Her last p.o. intake was at 10 AM this morning including coffee with creamer. The patients CT scan was consistent with acute appendicitis without perforation or abscess.   ROS: Review of Systems  Constitutional: Negative for chills and fever.  Respiratory: Negative for cough and shortness of breath.   Cardiovascular: Negative for chest pain.  Gastrointestinal: Positive for abdominal pain and nausea. Negative for blood in stool, constipation, diarrhea, melena and vomiting.  Genitourinary: Negative for dysuria.  All other systems reviewed and are negative.   All systems reviewed and otherwise negative except for as above  History reviewed. No pertinent family history.  Past Medical History:   Diagnosis Date  . GERD (gastroesophageal reflux disease)     Past Surgical History:  Procedure Laterality Date  . CESAREAN SECTION N/A 07/25/2018   Procedure: CESAREAN SECTION;  Surgeon: Allyn Kenner, DO;  Location: [place];  Service: Obstetrics;  Laterality: N/A;  . TONSILLECTOMY    . WISDOM TOOTH EXTRACTION      Social History:  reports that she has never smoked. She has never used smokeless tobacco. She reports previous alcohol use. She reports that she does not use drugs.  Allergies: No Known Allergies  (Not in a hospital admission)   Prior to Admission medications   Medication Sig Start Date End Date Taking? Authorizing Provider  Ascorbic Acid (VITAMIN C) 100 MG tablet Take 100 mg by mouth daily.   Yes [provider]  cholecalciferol (VITAMIN D3) 25 MCG (1000 UT) tablet Take 1,000 Units by mouth daily.   Yes [provider]  Multiple Vitamin (MULTIVITAMIN WITH MINERALS) TABS tablet Take 1 tablet by mouth daily.   Yes [provider]  ibuprofen (ADVIL,MOTRIN) 600 MG tablet Take 1 tablet (600 mg total) by mouth every 6 (six) hours as needed. Patient not taking: Reported on 11/07/2018 07/28/18   Vanessa Kick, MD  oxyCODONE-acetaminophen (PERCOCET/ROXICET) 5-325 MG tablet Take 1-2 tablets by mouth every 6 (six) hours as needed for severe pain. Patient not taking: Reported on 11/07/2018 07/28/18   Vanessa Kick, MD    Blood pressure 108/80, pulse 80, temperature 98.2 F (36.8 C), temperature source Oral, resp. rate 20, height 5\' 7"  (1.702 m), weight 79.8 kg, SpO2 97 %, unknown if currently breastfeeding. Physical Exam: General: pleasant, WD/WN white female who  is laying in bed in NAD HEENT: head is normocephalic, atraumatic.  Sclera are noninjected.  Pupils equal and round.  Ears and nose without any masses or lesions.  Mouth is pink and moist. Dentition fair Heart: regular, rate, and rhythm.  No obvious murmurs, gallops, or rubs noted.   Palpable pedal pulses bilaterally Lungs: CTAB, no wheezes, rhonchi, or rales noted.  Respiratory effort nonlabored Abd: Soft, ND, tenderness to the RLQ with + McBurney's point tenderness. +BS, no masses, hernias, or organomegaly MS: all 4 extremities are symmetrical with no cyanosis, clubbing, or edema. Skin: warm and dry with no masses, lesions, or rashes Psych: A&Ox3 with an appropriate affect. Neuro: cranial nerves grossly intact, extremity CSM intact bilaterally, normal speech  Results for orders placed or performed during the hospital encounter of 11/07/18 (from the past 48 hour(s))  CBC with Differential     Status: Abnormal   Collection Time: 11/07/18 11:45 AM  Result Value Ref Range   WBC 7.0 4.0 - 10.5 K/uL   RBC 5.13 (H) 3.87 - 5.11 MIL/uL   Hemoglobin 14.7 12.0 - 15.0 g/dL   HCT 45.1 36.0 - 46.0 %   MCV 87.9 80.0 - 100.0 fL   MCH 28.7 26.0 - 34.0 pg   MCHC 32.6 30.0 - 36.0 g/dL   RDW 13.9 11.5 - 15.5 %   Platelets 210 150 - 400 K/uL   nRBC 0.0 0.0 - 0.2 %   Neutrophils Relative % 64 %   Neutro Abs 4.5 1.7 - 7.7 K/uL   Lymphocytes Relative 27 %   Lymphs Abs 1.9 0.7 - 4.0 K/uL   Monocytes Relative 5 %   Monocytes Absolute 0.4 0.1 - 1.0 K/uL   Eosinophils Relative 3 %   Eosinophils Absolute 0.2 0.0 - 0.5 K/uL   Basophils Relative 1 %   Basophils Absolute 0.1 0.0 - 0.1 K/uL   Immature Granulocytes 0 %   Abs Immature Granulocytes 0.02 0.00 - 0.07 K/uL    Comment: Performed at [facility], [address], [place], [place] [address]  Comprehensive metabolic panel     Status: Abnormal   Collection Time: 11/07/18 11:45 AM  Result Value Ref Range   Sodium 141 135 - 145 mmol/L   Potassium 4.0 3.5 - 5.1 mmol/L   Chloride 106 98 - 111 mmol/L   CO2 21 (L) 22 - 32 mmol/L   Glucose, Bld 90 70 - 99 mg/dL   BUN 11 6 - 20 mg/dL   Creatinine, Ser 0.76 0.44 - 1.00 mg/dL   Calcium 9.4 8.9 - 10.3 mg/dL   Total Protein 7.5 6.5 - 8.1 g/dL   Albumin 3.9 3.5 - 5.0 g/dL   AST 18  15 - 41 U/L   ALT 22 0 - 44 U/L   Alkaline Phosphatase 79 38 - 126 U/L   Total Bilirubin 0.5 0.3 - 1.2 mg/dL   GFR calc non Af Amer >60 >60 mL/min   GFR calc Af Amer >60 >60 mL/min   Anion gap 14 5 - 15    Comment: Performed at [facility], [place] [address] [place], [place] [address]  Lipase, blood     Status: None   Collection Time: 11/07/18 11:45 AM  Result Value Ref Range   Lipase 27 11 - 51 U/L    Comment: Performed at [facility] [address] [place], [place] [address]  Wet prep, genital     Status: Abnormal   Collection Time:  11/07/18 12:02 PM  Result Value Ref Range   Yeast Wet Prep HPF POC NONE SEEN NONE SEEN   Trich, Wet Prep NONE SEEN NONE SEEN   Clue Cells Wet Prep HPF POC NONE SEEN NONE SEEN   WBC, Wet Prep HPF POC MANY (A) NONE SEEN   Sperm NONE SEEN     Comment: Performed at [facility], [place] [address] [place], [place] [address]  I-Stat beta hCG blood, ED     Status: None   Collection Time: 11/07/18 12:14 PM  Result Value Ref Range   I-stat hCG, quantitative <5.0 <5 mIU/mL   Comment 3            Comment:   GEST. AGE      CONC.  (mIU/mL)   <=1 WEEK        5 - 50     2 WEEKS       50 - 500     3 WEEKS       100 - 10,000     4 WEEKS     1,000 - 30,000        FEMALE AND NON-PREGNANT FEMALE:     LESS THAN 5 mIU/mL    Ct Abdomen Pelvis W Contrast  Result Date: 11/07/2018 CLINICAL DATA:  Right lower quadrant abdominal pain since last night. Nausea. Cesarean delivery 3 months prior. EXAM: CT ABDOMEN AND PELVIS WITH CONTRAST TECHNIQUE: Multidetector CT imaging of the abdomen and pelvis was performed using the standard protocol following bolus administration of intravenous contrast. CONTRAST:  156mL OMNIPAQUE IOHEXOL 300 MG/ML  SOLN COMPARISON:  None. FINDINGS: Lower chest: No significant pulmonary nodules or acute consolidative airspace disease. Hepatobiliary: Normal liver size. Subcentimeter hypodense lateral segment left liver lobe lesion is too  small to characterize and requires no follow-up unless the patient has risk factors for liver malignancy. No additional liver lesions. Normal gallbladder with no radiopaque cholelithiasis. No biliary ductal dilatation. Pancreas: Normal, with no mass or duct dilation. Spleen: Normal size spleen. Small 0.9 cm cystic anterior splenic lesion is most compatible with a benign lesion such as a lymphangioma. No additional splenic lesions. Adrenals/Urinary Tract: Normal adrenals. Normal kidneys with no hydronephrosis and no renal mass. Normal bladder. Stomach/Bowel: Normal non-distended stomach. Normal caliber small bowel with no small bowel wall thickening. Diffusely thick walled appendix with appendiceal wall hyperenhancement and periappendiceal fat stranding, compatible with acute appendicitis. Appendix: Location: Right lower quadrant Diameter: 13 mm Appendicolith: Not present Mucosal hyper-enhancement: Present Extraluminal gas: Not present Periappendiceal collection: Not present Normal large bowel with no diverticulosis, large bowel wall thickening or pericolonic fat stranding. Vascular/Lymphatic: Normal caliber abdominal aorta. Patent portal, splenic, hepatic and renal veins. No pathologically enlarged lymph nodes in the abdomen or pelvis. Reproductive: Grossly normal uterus.  No adnexal mass. Other: No pneumoperitoneum, ascites or focal fluid collection. Tiny fat containing umbilical hernia. Musculoskeletal: No aggressive appearing focal osseous lesions. IMPRESSION: Acute appendicitis. No free air. No abscess. No calcified appendicolith. Electronically Signed   By: Ilona Sorrel M.D.   On: 11/07/2018 13:43      Assessment/Plan Acute Appendicitis  - Hx, PE, and CT c/w acute appendicitis. No evidence of abscess or perforation. Will plan for OR today. Agree with IV abx. I have discussed the procedure and risks of appendectomy. The risks include but are not limited to bleeding, infection, wound problems, anesthesia,  injury to intra-abdominal organs, possibility of postoperative ileus. She seems to understand and agrees with the plan.  Jillyn Ledger, [facility] 11/07/2018, 2:53 PM Pager: [phone]

## 2018-11-07 NOTE — Transfer of Care (Signed)
Immediate Anesthesia Transfer of Care Note  Patient: [name]  Procedure(s) Performed: APPENDECTOMY LAPAROSCOPIC (N/A Abdomen)  Patient Location: PACU  Anesthesia Type:General  Level of Consciousness: awake, alert  and oriented  Airway & Oxygen Therapy: Patient Spontanous Breathing and Patient connected to face mask oxygen  Post-op Assessment: Report given to RN and Post -op Vital signs reviewed and stable  Post vital signs: Reviewed and stable  Last Vitals:  Vitals Value Taken Time  BP 120/63 11/07/2018  5:27 PM  Temp    Pulse 91 11/07/2018  5:31 PM  Resp 17 11/07/2018  5:31 PM  SpO2 100 % 11/07/2018  5:31 PM  Vitals shown include unvalidated device data.  Last Pain:  Vitals:   11/07/18 1727  TempSrc:   PainSc: (P) Asleep         Complications: No apparent anesthesia complications

## 2018-11-07 NOTE — ED Notes (Signed)
Report given to Arbie Cookey, RN on Short stay unit.  Pt ready for transport

## 2018-11-07 NOTE — Op Note (Signed)
Appendectomy, Lap, Procedure Note  Indications: The patient presented with a history of right-sided abdominal pain. A CT and exam  revealed findings consistent with acute appendicitis.The procedure has been discussed with the patient.  Alternative therapies have been discussed with the patient.  Operative risks include bleeding,  Infection,  Organ injury,  Abscess, bowel bladder Nerve injury,  Blood vessel injury,  DVT,  Pulmonary embolism,  Death,  And possible reoperation.  Medical management risks include worsening of present situation.  The success of the procedure is 50 -90 % at treating patients symptoms.  The patient understands and agrees to proceed.  Pre-operative Diagnosis: Acute appendicitis without mention of peritonitis  Post-operative Diagnosis: Same  Surgeon: Turner Daniels  MD  Assistants: NONE  Anesthesia: General endotracheal anesthesia and Local anesthesia 0.5% bupivacaine  ASA Class: 1  Procedure Details  The patient was seen again in the Holding Room. The risks, benefits, complications, treatment options, and expected outcomes were discussed with the patient and/or family. The possibilities of reaction to medication, pulmonary aspiration, perforation of viscus, bleeding, recurrent infection, finding a normal appendix, the need for additional procedures, failure to diagnose a condition, and creating a complication requiring transfusion or operation were discussed. There was concurrence with the proposed plan and informed consent was obtained. The site of surgery was properly noted/marked. The patient was taken to Operating Room, identified as [name] and the procedure verified as Appendectomy. A Time Out was held and the above information confirmed.  The patient was placed in the supine position and general anesthesia was induced, along with placement of orogastric tube, Venodyne boots, and a Foley catheter. The abdomen was prepped and draped in a sterile fashion. A  one centimeter infraumbilical incision was made and the peritoneal cavity was accessed using the OPEN  technique. The pneumoperitoneum was then established to steady pressure of 12 mmHg. A 12 mm port was placed through the umbilical incision. Additional 5 mm cannulas then placed in the left lower quadrant of the abdomen and half way between the umbilicus and xyphoid under direct vision. A careful evaluation of the entire abdomen was carried out. The patient was placed in Trendelenburg and left lateral decubitus position. The small intestines were retracted in the cephalad and left lateral direction away from the pelvis and right lower quadrant. The patient was found to have an enlarged and inflamed appendix that was extending into the pelvis. There was no evidence of perforation.  The appendix was carefully dissected. A window was made in the mesoappendix at the base of the appendix. A harmonic scalpel was used across the mesoappendix. The appendix was divided at its base using an endo-GIA stapler. Minimal appendiceal stump was left in place. It was extracted using bag.  There was no evidence of bleeding, leakage, or complication after division of the appendix. Irrigation was also performed and irrigate suctioned from the abdomen as well.  The umbilical port site was closed using 0 vicryl pursestring sutures fashion at the level of the fascia. The trocar site skin wounds were closed using skin staples.  Instrument, sponge, and needle counts were correct at the conclusion of the case.   Findings: The appendix was found to be inflamed. There were not signs of necrosis.  There was not perforation. There was not abscess formation.  Estimated Blood Loss:  Minimal         Drains: NONE         Total IV Fluids: PER RECORD  Specimens: appendix         Complications:  None; patient tolerated the procedure well.         Disposition: PACU - hemodynamically stable.         Condition: stable

## 2018-11-08 LAB — HIV ANTIBODY (ROUTINE TESTING W REFLEX): HIV Screen 4th Generation wRfx: NONREACTIVE

## 2018-11-08 NOTE — Discharge Instructions (Signed)
[facility] ______[facility] °LAPAROSCOPIC SURGERY: POST OP INSTRUCTIONS °Always review your discharge instruction sheet given to you by the facility where your surgery was performed. °IF YOU HAVE DISABILITY OR FAMILY LEAVE FORMS, YOU MUST BRING THEM TO THE OFFICE FOR PROCESSING.   °DO NOT GIVE THEM TO YOUR DOCTOR. ° °1. A prescription for pain medication may be given to you upon discharge.  Take your pain medication as prescribed, if needed.  If narcotic pain medicine is not needed, then you may take acetaminophen (Tylenol) or ibuprofen (Advil) as needed. °2. Take your usually prescribed medications unless otherwise directed. °3. If you need a refill on your pain medication, please contact your pharmacy.  They will contact our office to request authorization. Prescriptions will not be filled after 5pm or on week-ends. °4. You should follow a light diet the first few days after arrival home, such as soup and crackers, etc.  Be sure to include lots of fluids daily. °5. Most patients will experience some swelling and bruising in the area of the incisions.  Ice packs will help.  Swelling and bruising can take several days to resolve.  °6. It is common to experience some constipation if taking pain medication after surgery.  Increasing fluid intake and taking a stool softener (such as Colace) will usually help or prevent this problem from occurring.  A mild laxative (Milk of Magnesia or Miralax) should be taken according to package instructions if there are no bowel movements after 48 hours. °7. Unless discharge instructions indicate otherwise, you may remove your bandages 24-48 hours after surgery, and you may shower at that time.  You may have steri-strips (small skin tapes) in place directly over the incision.  These strips should be left on the skin for 7-10 days.  If your surgeon used skin glue on the incision, you may shower in 24 hours.  The glue will flake off over the next 2-3 weeks.  Any sutures or  staples will be removed at the office during your follow-up visit. °8. ACTIVITIES:  You may resume regular (light) daily activities beginning the next day--such as daily self-care, walking, climbing stairs--gradually increasing activities as tolerated.  You may have sexual intercourse when it is comfortable.  Refrain from any heavy lifting or straining until approved by your doctor. °a. You may drive when you are no longer taking prescription pain medication, you can comfortably wear a seatbelt, and you can safely maneuver your car and apply brakes. °b. RETURN TO WORK:  __________________________________________________________ °9. You should see your doctor in the office for a follow-up appointment approximately 2-3 weeks after your surgery.  Make sure that you call for this appointment within a day or two after you arrive home to insure a convenient appointment time. °10. OTHER INSTRUCTIONS: __________________________________________________________________________________________________________________________ __________________________________________________________________________________________________________________________ °WHEN TO CALL YOUR DOCTOR: °1. Fever over 101.0 °2. Inability to urinate °3. Continued bleeding from incision. °4. Increased pain, redness, or drainage from the incision. °5. Increasing abdominal pain ° °The clinic staff is available to answer your questions during regular business hours.  Please don’t hesitate to call and ask to speak to one of the nurses for clinical concerns.  If you have a medical emergency, go to the nearest emergency room or call 911.  A surgeon from [facility] is always on call at the hospital. °[address], [address], [place], [place]  [address] ? [address] [address], [place], [place]   [address] °[phone] ? [phone] ? FAX [phone] °Web site:   [email or web address] °

## 2018-11-08 NOTE — Progress Notes (Signed)
[name] to be D/C'd  per MD order. Discussed with the patient and all questions fully answered.  VSS, Skin clean, dry and intact without evidence of skin break down, no evidence of skin tears noted.  IV catheter discontinued intact. Site without signs and symptoms of complications. Dressing and pressure applied.  An After Visit Summary was printed and given to the patient. Patient received prescription.  D/c education completed with patient/family including follow up instructions, medication list, d/c activities limitations if indicated, with other d/c instructions as indicated by MD - patient able to verbalize understanding, all questions fully answered.   Patient instructed to return to ED, call 911, or call MD for any changes in condition.   Patient to be escorted via [place], and D/C home via private auto.

## 2018-11-08 NOTE — Discharge Summary (Signed)
  Patient ID: [name] [id number] 32 y.o. April 19, 1986  Admit date: 11/07/2018  Discharge date and time: 11/08/2018  Admitting Physician: Erroll Luna  Discharge Physician: Adin Hector  Admission Diagnoses: Acute appendicitis, unspecified acute appendicitis type [K35.80] Appendicitis [K37]  Discharge Diagnoses: same  Operations: Procedure(s): APPENDECTOMY LAPAROSCOPIC  Admission Condition: fair  Discharged Condition: fair  Indication for Admission: This is a healthy 33 year old female, 3 months postpartum who presented to the emergency department with abdominal pain for 24 hours.  Some nausea.  No diarrhea.  Recent C-section.  Currently breast-feeding.  CT scan was consistent with acute appendicitis but no evidence of perforation or abscess.  She was seen in the emergency department by our service and was taken to the operating room by Dr. [facility] Course: The patient was resuscitated, given IV antibiotics and taken to the operating room where laparoscopic appendectomy was performed uneventfully.  The appendix was inflamed but there was no evidence of gangrene perforation or other pathologic process.  She was admitted overnight and did very well.  On postop day 1 she was feeling very well.  Had almost no pain.  Tolerating diet.  Wanted to go home.  Abdomen was soft and minimally tender.  The wounds look good.  Pain management was discussed and she said that she would be fine with plain Tylenol.  Diet and activities were discussed.  Wound care was discussed.  She was advised to call our office on Monday and set up an appointment to see [place] in about 3 weeks or if she was doing very well a telephone visit would probably suffice.  Pathology will be checked  Consults: None  Significant Diagnostic Studies: CT scan.  Blood work.  Surgical pathology  Treatments: surgery:   Disposition: Home  Patient Instructions:  Allergies as of 11/08/2018   No Known Allergies      Medication List    STOP taking these medications   cholecalciferol 25 MCG (1000 UT) tablet Commonly known as:  VITAMIN D3   ibuprofen 600 MG tablet Commonly known as:  ADVIL   multivitamin with minerals Tabs tablet   oxyCODONE-acetaminophen 5-325 MG tablet Commonly known as:  PERCOCET/ROXICET   vitamin C 100 MG tablet       Activity: No sports or heavy lifting for 3 weeks.  Otherwise activity as tolerated Diet: regular diet Wound Care: none needed  Follow-up:  With [facility] surgery in 3 weeks.  Signed: Edsel Petrin. Dalbert Batman, M.D., FACS General and minimally invasive surgery Breast and Colorectal Surgery  11/08/2018, 9:52 AM

## 2018-11-09 NOTE — Anesthesia Postprocedure Evaluation (Signed)
Anesthesia Post Note  Patient: [name]  Procedure(s) Performed: APPENDECTOMY LAPAROSCOPIC (N/A Abdomen)     Patient location during evaluation: PACU Anesthesia Type: General Level of consciousness: awake and alert Pain management: pain level controlled Vital Signs Assessment: post-procedure vital signs reviewed and stable Respiratory status: spontaneous breathing, nonlabored ventilation, respiratory function stable and patient connected to nasal cannula oxygen Cardiovascular status: blood pressure returned to baseline and stable Postop Assessment: no apparent nausea or vomiting Anesthetic complications: no    Last Vitals:  Vitals:   11/08/18 0532 11/08/18 1007  BP: 113/69 117/72  Pulse: 70 75  Resp:    Temp: 36.7 C 37.1 C  SpO2: 99% 98%    Last Pain:  Vitals:   11/08/18 1007  TempSrc: Oral  PainSc:                  Becca Bayne

## 2018-11-10 ENCOUNTER — Encounter (HOSPITAL_COMMUNITY): Payer: Self-pay | Admitting: Surgery

## 2018-11-10 LAB — GC/CHLAMYDIA PROBE AMP (~~LOC~~) NOT AT ARMC
Chlamydia: NEGATIVE
Neisseria Gonorrhea: NEGATIVE
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
## 2019-12-29 IMAGING — US US OB < 14 WEEKS - US OB TV
1 series · 13 of 28 positions shown · non-contrast
Comparison: None.

CLINICAL DATA: Vaginal bleeding

EXAM:
OBSTETRIC <14 WK US AND TRANSVAGINAL OB US
TECHNIQUE: Both transabdominal and transvaginal ultrasound examinations were
performed for complete evaluation of the gestation as well as the
maternal uterus, adnexal regions, and pelvic cul-de-sac.
Transvaginal technique was performed to assess early pregnancy.

[Series 1: us ob < 14 weeks - us ob tv · 0.25mm/px · 13 of 77 slices shown]
[im 3/77]
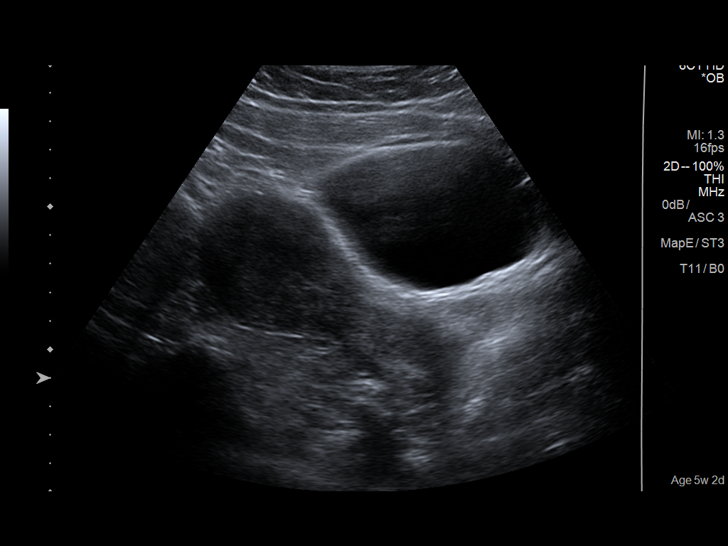
[im 9/77]
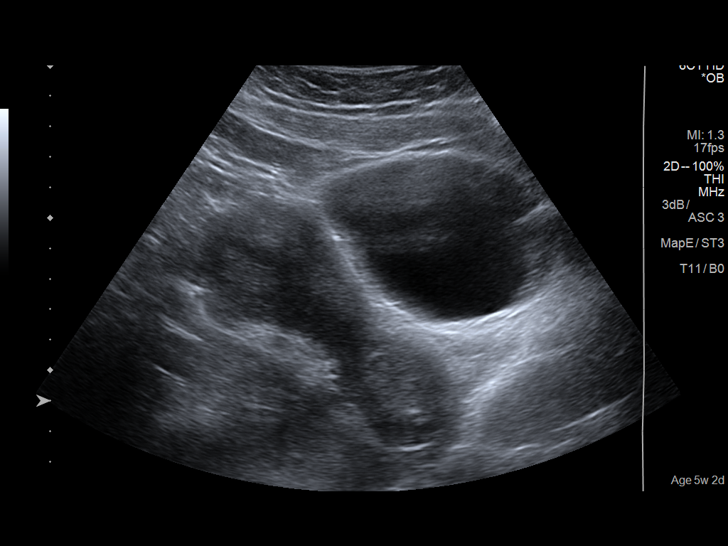
[im 15/77]
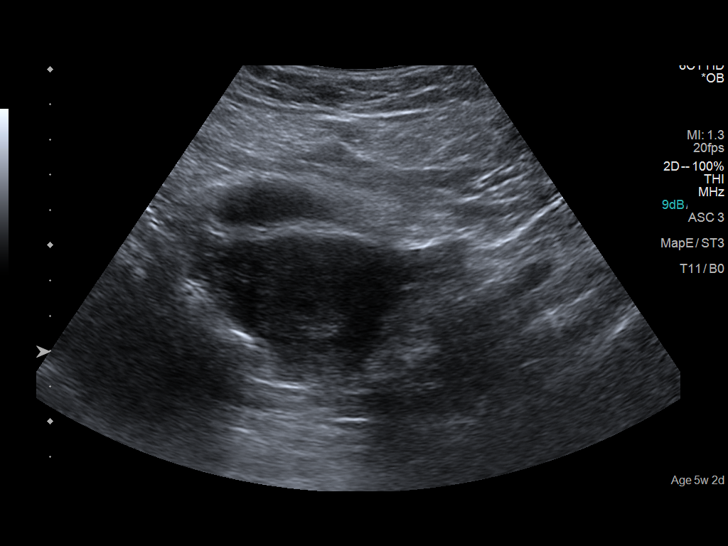
[im 20/77]
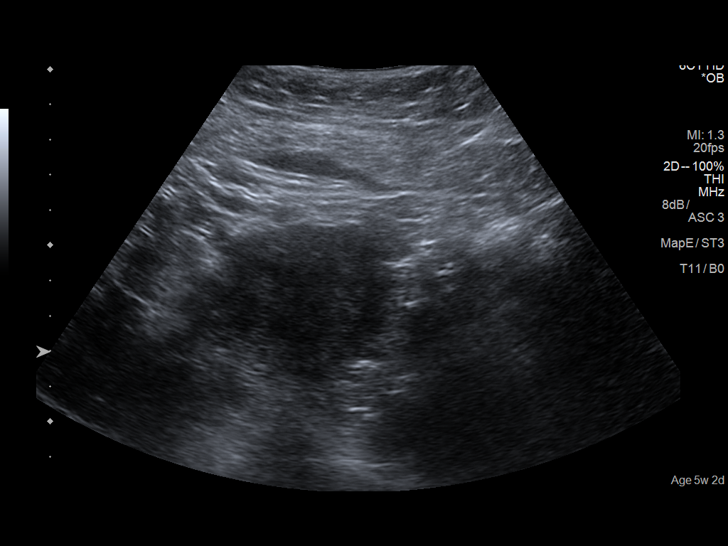
[im 26/77]
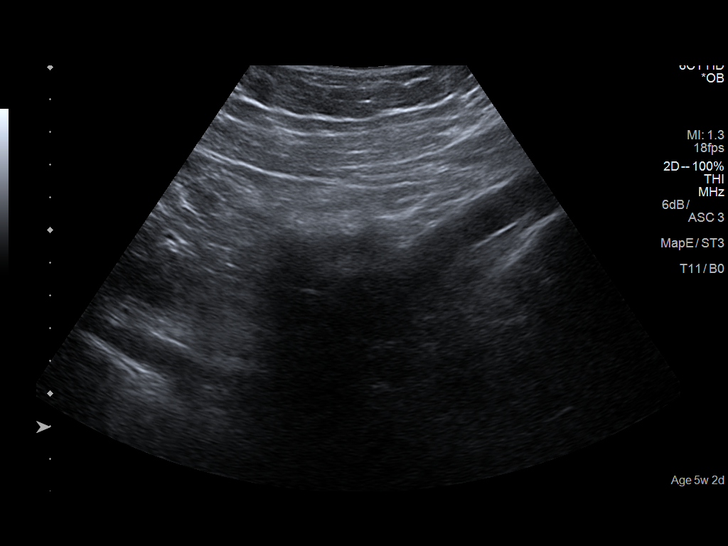
[im 31/77]
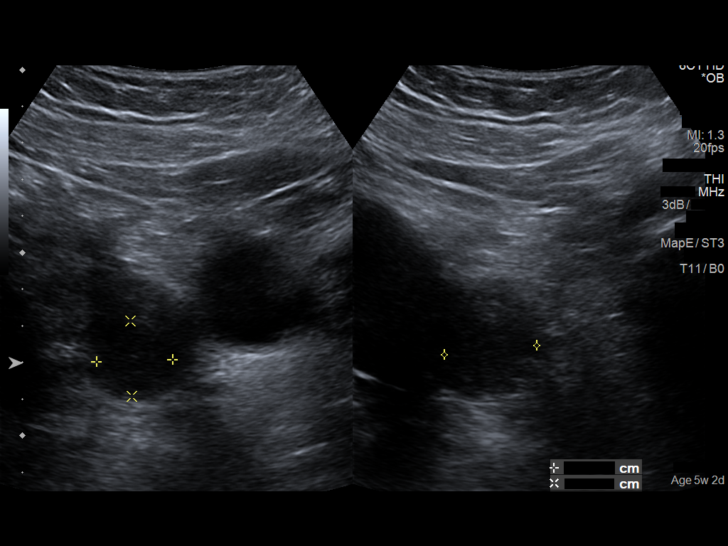
[im 40/77]
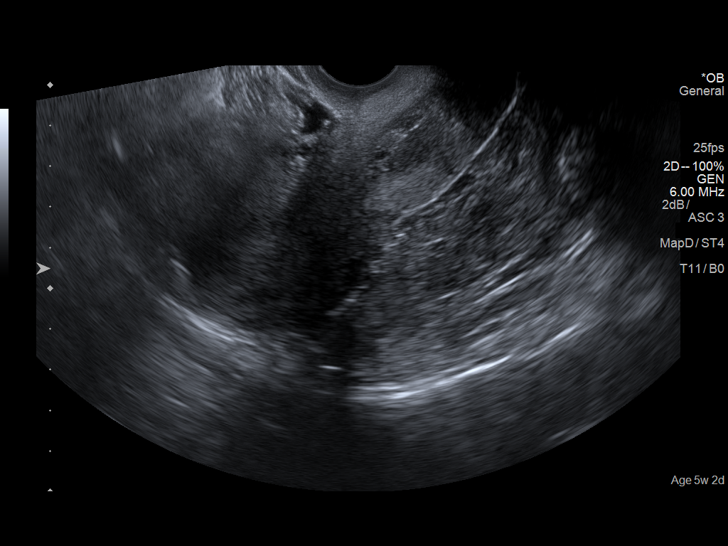
[im 46/77]
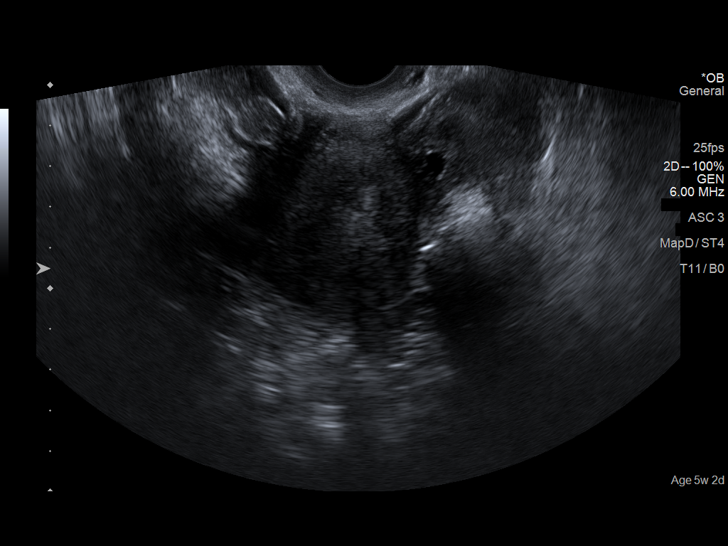
[im 51/77]
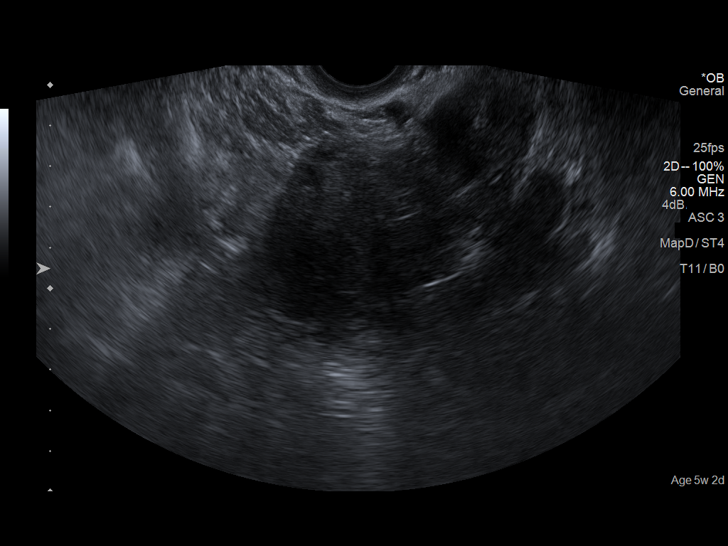
[im 57/77]
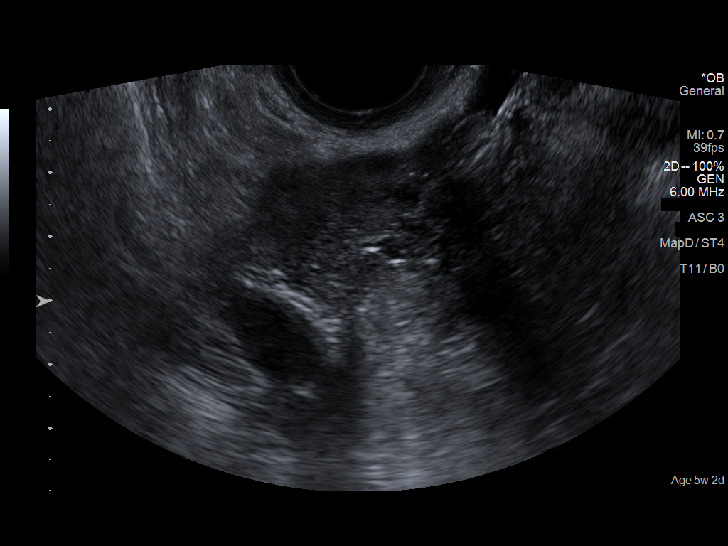
[im 62/77]
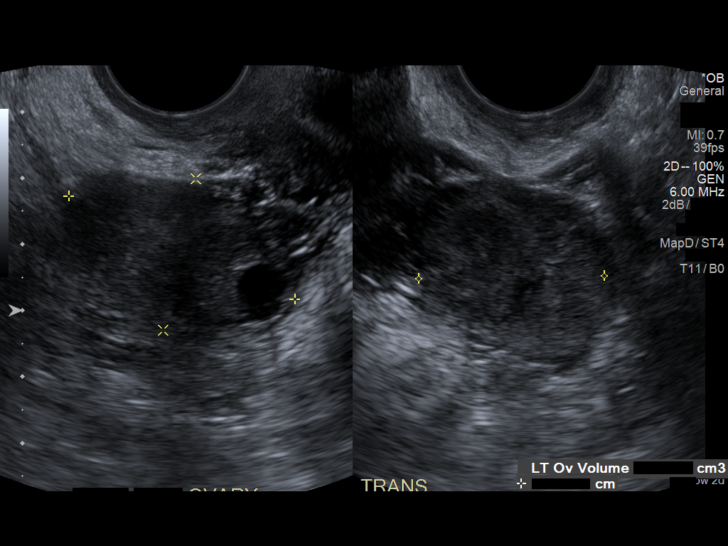
[im 68/77]
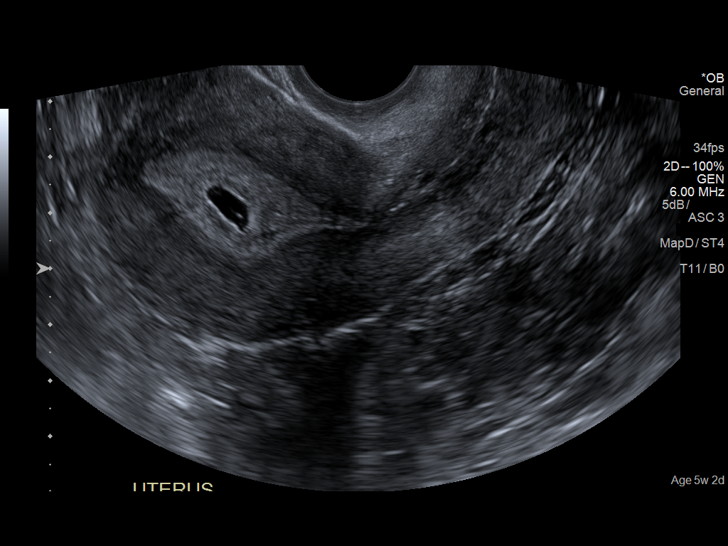
[im 74/77]
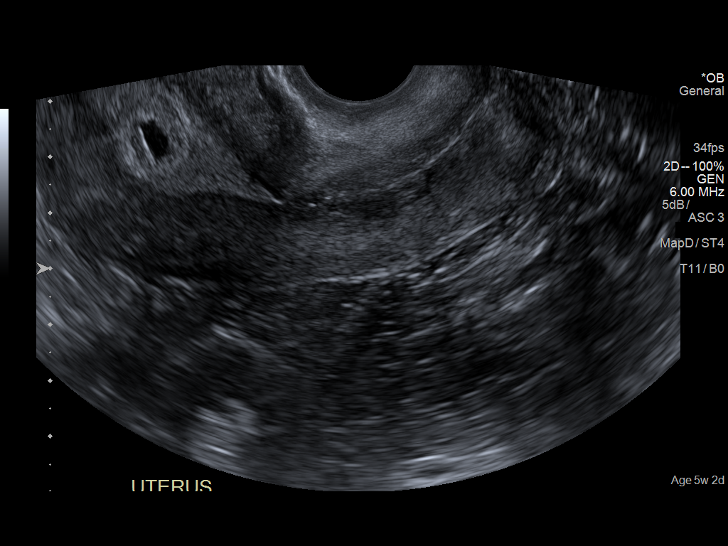

[13 of 28 positions shown; findings below may reference images not displayed]

FINDINGS: Intrauterine gestational sac: Visualized

Yolk sac:  Not visualized

Embryo:  Not visualized

Cardiac Activity: Not visualized

MSD: 7 mm   5 w   3 d

Subchorionic hemorrhage:  None visualized.

Maternal uterus/adnexae: Uterus measures 9.9 x 4.2 x 5.2 cm. No
intrauterine mass. Cervical os is closed. Right ovary measures 2.4 x
1.9 x 2.5 cm. Left ovary measures 3.8 x 2.4 x 2.8 cm. Corpus luteum
on the left measures 2.7 x 1.8 x 2.0 cm. No free pelvic fluid.
IMPRESSION: Probable early intrauterine gestational sac, but no yolk sac, fetal
pole, or cardiac activity yet visualized. Recommend follow-up
quantitative B-HCG levels and follow-up US in 14 days to assess
viability. This recommendation follows SRU consensus guidelines:
Diagnostic Criteria for Nonviable Pregnancy Early in the First
Trimester. N Engl J Med 1058; [DATE]. based on gestational sac
size, estimated gestational age is 5+ weeks.

Study otherwise unremarkable.

## 2020-09-08 LAB — OB RESULTS CONSOLE ANTIBODY SCREEN: Antibody Screen: NEGATIVE

## 2020-09-08 LAB — OB RESULTS CONSOLE ABO/RH: RH Type: POSITIVE

## 2020-09-08 LAB — OB RESULTS CONSOLE HIV ANTIBODY (ROUTINE TESTING): HIV: NONREACTIVE

## 2020-09-08 LAB — OB RESULTS CONSOLE HEPATITIS B SURFACE ANTIGEN: Hepatitis B Surface Ag: NEGATIVE

## 2020-09-08 LAB — OB RESULTS CONSOLE RUBELLA ANTIBODY, IGM: Rubella: NON-IMMUNE/NOT IMMUNE

## 2020-09-08 LAB — OB RESULTS CONSOLE RPR: RPR: NONREACTIVE

## 2020-10-24 ENCOUNTER — Encounter (HOSPITAL_COMMUNITY): Payer: Self-pay | Admitting: Obstetrics and Gynecology

## 2020-10-24 ENCOUNTER — Inpatient Hospital Stay (HOSPITAL_COMMUNITY)
Admission: AD | Admit: 2020-10-24 | Discharge: 2020-10-24 | Disposition: A | Discharge status: Home / Self Care | Payer: 59 | Attending: Obstetrics and Gynecology | Admitting: Obstetrics and Gynecology

## 2020-10-24 ENCOUNTER — Other Ambulatory Visit: Payer: Self-pay

## 2020-10-24 DIAGNOSIS — K922 Gastrointestinal hemorrhage, unspecified: Secondary | ICD-10-CM | POA: Diagnosis not present

## 2020-10-24 DIAGNOSIS — Z3A17 17 weeks gestation of pregnancy: Secondary | ICD-10-CM

## 2020-10-24 DIAGNOSIS — O99612 Diseases of the digestive system complicating pregnancy, second trimester: Secondary | ICD-10-CM

## 2020-10-24 DIAGNOSIS — O99891 Other specified diseases and conditions complicating pregnancy: Secondary | ICD-10-CM | POA: Insufficient documentation

## 2020-10-24 DIAGNOSIS — R1013 Epigastric pain: Secondary | ICD-10-CM | POA: Diagnosis present

## 2020-10-24 HISTORY — DX: Anxiety disorder, unspecified: F41.9

## 2020-10-24 LAB — COMPREHENSIVE METABOLIC PANEL
ALT: 21 U/L (ref 0–44)
AST: 18 U/L (ref 15–41)
Albumin: 3 g/dL — ABNORMAL LOW (ref 3.5–5.0)
Alkaline Phosphatase: 46 U/L (ref 38–126)
Anion gap: 12 (ref 5–15)
BUN: 8 mg/dL (ref 6–20)
CO2: 20 mmol/L — ABNORMAL LOW (ref 22–32)
Calcium: 8.3 mg/dL — ABNORMAL LOW (ref 8.9–10.3)
Chloride: 103 mmol/L (ref 98–111)
Creatinine, Ser: 0.55 mg/dL (ref 0.44–1.00)
GFR, Estimated: >60 mL/min (ref >60)
Glucose, Bld: 77 mg/dL (ref 70–99)
Potassium: 3.8 mmol/L (ref 3.5–5.1)
Sodium: 135 mmol/L (ref 135–145)
Total Bilirubin: 0.4 mg/dL (ref 0.3–1.2)
Total Protein: 6.3 g/dL — ABNORMAL LOW (ref 6.5–8.1)

## 2020-10-24 LAB — CBC
HCT: 33.2 % — ABNORMAL LOW (ref 36.0–46.0)
Hemoglobin: 11.2 g/dL — ABNORMAL LOW (ref 12.0–15.0)
MCH: 30.7 pg (ref 26.0–34.0)
MCHC: 33.7 g/dL (ref 30.0–36.0)
MCV: 91 fL (ref 80.0–100.0)
Platelets: 205 10*3/uL (ref 150–400)
RBC: 3.65 MIL/uL — ABNORMAL LOW (ref 3.87–5.11)
RDW: 13.4 % (ref 11.5–15.5)
WBC: 9.8 10*3/uL (ref 4.0–10.5)
nRBC: 0 % (ref 0.0–0.2)

## 2020-10-24 LAB — URINALYSIS, ROUTINE W REFLEX MICROSCOPIC
Bilirubin Urine: NEGATIVE
Glucose, UA: NEGATIVE mg/dL
Hgb urine dipstick: NEGATIVE
Ketones, ur: 80 mg/dL — AB
Leukocytes,Ua: NEGATIVE
Nitrite: NEGATIVE
Protein, ur: NEGATIVE mg/dL
Specific Gravity, Urine: 1.023 (ref 1.005–1.030)
pH: 5 (ref 5.0–8.0)

## 2020-10-24 MED ORDER — SUCRALFATE 1 GM/10ML PO SUSP
1.0000 g | Freq: Once | ORAL | Status: AC
  Administered 2020-10-24: 1 g via ORAL
  Filled 2020-10-24: qty 10

## 2020-10-24 MED ORDER — PANTOPRAZOLE SODIUM 40 MG PO TBEC
40.0000 mg | DELAYED_RELEASE_TABLET | Freq: Once | ORAL | Status: AC
  Administered 2020-10-24: 40 mg via ORAL
  Filled 2020-10-24: qty 1

## 2020-10-24 MED ORDER — SUCRALFATE 1 GM/10ML PO SUSP: 1.0000 g | Freq: Four times a day (QID) | ORAL | 1 refills | Status: DC

## 2020-10-24 MED ORDER — PANTOPRAZOLE SODIUM 40 MG PO TBEC: 40.0000 mg | DELAYED_RELEASE_TABLET | Freq: Two times a day (BID) | ORAL | 1 refills | Status: DC

## 2020-10-24 NOTE — MAU Note (Signed)
[name] is a 35 y.o. at [redacted]w[redacted]d here in MAU reporting: since Thursday afternoon has been having stomach pain and decreased appetite. On Saturday had very dark stool. Has been trying mylanta with no relief. Went to [place] PCP this AM and states her stool was positive for blood and they told her to come here. Having some upper abdominal pain. Denies bleeding or LOF.  Onset of complaint: ongoing  Pain score: 2/10  Vitals:   10/24/20 1414  BP: 109/64  Pulse: 96  Resp: 16  Temp: 98.6 F (37 C)  SpO2: 99%     FHT:147  Lab orders placed from triage: UA

## 2020-10-24 NOTE — MAU Note (Signed)
Black and tarry stool x3 on Sat. No active bleeding.  Denies  Hemorrhoids.  Never had anything like this before.  Just doesn't feel good, has zero energy. Has been very stressed with job.

## 2020-10-24 NOTE — MAU Provider Note (Addendum)
History     CSN: [id number]  Arrival date and time: 10/24/20 1345   Event Date/Time   First Provider Initiated Contact with Patient 10/24/20 1517      Chief Complaint  Patient presents with  . Dark stool  . Abdominal Pain   HPI This is a 35yo G2P1001 at [redacted]w[redacted]d who presents with severe epigastric pain that started 4 days ago (Thursday). Pain has been increasing. On Saturday, she had 2 black tar-like stools. She called her primary OB on Saturday - was told to take mylanta and miralax, which she took Saturday night. Sunday, she had a lot of loose green stools. Today, she went to [facility] - the doctor there did a hemoccult test, which was positive. They instructed her to come here for further evaluation.   OB History    Gravida  2   Para  1   Term  1   Preterm      AB      Living  1     SAB      IAB      Ectopic      Multiple  0   Live Births  1           Past Medical History:  Diagnosis Date  . Anxiety    sees a therapist  . GERD (gastroesophageal reflux disease)     Past Surgical History:  Procedure Laterality Date  . CESAREAN SECTION N/A 07/25/2018   Procedure: CESAREAN SECTION;  Surgeon: Allyn Kenner, DO;  Location: [facility];  Service: Obstetrics;  Laterality: N/A;  . LAPAROSCOPIC APPENDECTOMY N/A 11/07/2018   Procedure: APPENDECTOMY LAPAROSCOPIC;  Surgeon: Erroll Luna, MD;  Location: [facility];  Service: General;  Laterality: N/A;  . TONSILLECTOMY    . WISDOM TOOTH EXTRACTION      Family History  Problem Relation Age of Onset  . Healthy Mother   . Healthy Father     Social History   Tobacco Use  . Smoking status: Never Smoker  . Smokeless tobacco: Never Used  Vaping Use  . Vaping Use: Never used  Substance Use Topics  . Alcohol use: Not Currently    Comment: prior to preg  . Drug use: Never    Allergies: No Known Allergies  Medications Prior to Admission  Medication Sig Dispense Refill Last Dose  . Alum &  Mag Hydroxide-Simeth (MYLANTA PO) Take by mouth.   10/23/2020 at Unknown time  . doxylamine, Sleep, (UNISOM) 25 MG tablet Take 25 mg by mouth at bedtime as needed.   10/23/2020 at Unknown time  . polyethylene glycol (MIRALAX / GLYCOLAX) 17 g packet Take 17 g by mouth daily.   10/22/2020 at Unknown time  . Prenatal Vit-Fe Fumarate-FA (MULTIVITAMIN-PRENATAL) 27-0.8 MG TABS tablet Take 1 tablet by mouth daily at 12 noon.   Past Week at Unknown time    Review of Systems  All other systems reviewed and are negative.  Physical Exam   Blood pressure 103/64, pulse 89, temperature 98.6 F (37 C), temperature source Oral, resp. rate 16, height 5\' 7"  (1.702 m), weight 87.5 kg, SpO2 99 %, unknown if currently breastfeeding.  Physical Exam Vitals and nursing note reviewed.  Constitutional:      Appearance: She is well-developed.  HENT:     Head: Normocephalic and atraumatic.  Eyes:     Extraocular Movements: Extraocular movements intact.     Pupils: Pupils are equal, round, and reactive to light.  Cardiovascular:  Rate and Rhythm: Normal rate and regular rhythm.     Heart sounds: Normal heart sounds. No murmur heard. No friction rub. No gallop.   Pulmonary:     Effort: Pulmonary effort is normal.     Breath sounds: Normal breath sounds.  Abdominal:     General: Abdomen is flat.     Palpations: Abdomen is soft.     Tenderness: There is abdominal tenderness in the epigastric area. There is no guarding or rebound.  Skin:    General: Skin is warm and dry.     Capillary Refill: Capillary refill takes less than 2 seconds.  Neurological:     General: No focal deficit present.     Mental Status: She is alert and oriented to person, place, and time.  Psychiatric:        Mood and Affect: Mood normal.        Behavior: Behavior normal.    Results for orders placed or performed during the hospital encounter of 10/24/20 (from the past 24 hour(s))  Urinalysis, Routine w reflex microscopic Urine,  Clean Catch     Status: Abnormal   Collection Time: 10/24/20  1:55 PM  Result Value Ref Range   Color, Urine YELLOW YELLOW   APPearance HAZY (A) CLEAR   Specific Gravity, Urine 1.023 1.005 - 1.030   pH 5.0 5.0 - 8.0   Glucose, UA NEGATIVE NEGATIVE mg/dL   Hgb urine dipstick NEGATIVE NEGATIVE   Bilirubin Urine NEGATIVE NEGATIVE   Ketones, ur 80 (A) NEGATIVE mg/dL   Protein, ur NEGATIVE NEGATIVE mg/dL   Nitrite NEGATIVE NEGATIVE   Leukocytes,Ua NEGATIVE NEGATIVE  CBC     Status: Abnormal   Collection Time: 10/24/20  3:27 PM  Result Value Ref Range   WBC 9.8 4.0 - 10.5 K/uL   RBC 3.65 (L) 3.87 - 5.11 MIL/uL   Hemoglobin 11.2 (L) 12.0 - 15.0 g/dL   HCT 33.2 (L) 36.0 - 46.0 %   MCV 91.0 80.0 - 100.0 fL   MCH 30.7 26.0 - 34.0 pg   MCHC 33.7 30.0 - 36.0 g/dL   RDW 13.4 11.5 - 15.5 %   Platelets 205 150 - 400 K/uL   nRBC 0.0 0.0 - 0.2 %  Comprehensive metabolic panel     Status: Abnormal   Collection Time: 10/24/20  3:27 PM  Result Value Ref Range   Sodium 135 135 - 145 mmol/L   Potassium 3.8 3.5 - 5.1 mmol/L   Chloride 103 98 - 111 mmol/L   CO2 20 (L) 22 - 32 mmol/L   Glucose, Bld 77 70 - 99 mg/dL   BUN 8 6 - 20 mg/dL   Creatinine, Ser 0.55 0.44 - 1.00 mg/dL   Calcium 8.3 (L) 8.9 - 10.3 mg/dL   Total Protein 6.3 (L) 6.5 - 8.1 g/dL   Albumin 3.0 (L) 3.5 - 5.0 g/dL   AST 18 15 - 41 U/L   ALT 21 0 - 44 U/L   Alkaline Phosphatase 46 38 - 126 U/L   Total Bilirubin 0.4 0.3 - 1.2 mg/dL   GFR, Estimated >60 >60 mL/min   Anion gap 12 5 - 15     MAU Course  Procedures  MDM Protonix and Carafate given with improvement of abdominal pain.  H pylori pending.  Assessment and Plan   1. [redacted] weeks gestation of pregnancy   2. Upper GI bleed    Discussed pt with Dr Michail Sermon - continue carafate and protonix. Will follow up  outpatient.  Return with severe abdominal pain, increased melena, presyncopal symptoms.   Truett Mainland 10/24/2020, 3:14 PM

## 2020-10-24 NOTE — Discharge Instructions (Signed)
https://www.ncbi.nlm.nih.gov/books/NBK537291/#:~:text=Interventional%20Radiology-,Deterrence%20and%20Patient%20Education,also%20known%20as%20the%20colon)."> https://www.ncbi.nlm.nih.gov/books/NBK470300/">  °Upper Gastrointestinal Bleeding ° °Upper gastrointestinal (GI) bleeding is bleeding from the esophagus, the stomach, or the first part of the small intestine (duodenum). The esophagus is the part of the body that moves food from the mouth to the stomach. Upper GI bleeding may cause a person to vomit blood or have bloody or black stools (feces). °Bleeding can range from mild to serious or even life-threatening. Continued or heavy bleeding may need emergency treatment at a hospital. °What are the causes? °This condition may be caused by: °· A sore in the lining of your stomach or duodenum (peptic ulcer). This is the most common cause of GI bleeding. °· Esophagitis, or inflammation of your esophagus. °· A tear in the esophagus. °· Cancer of the esophagus, stomach, or duodenum. °· An abnormal or weakened blood vessel in one of your upper GI structures. °· A disorder that makes it hard to form blood clots and causes easy bleeding (coagulopathy). °What increases the risk? °The following factors may make you more likely to develop this condition: °· Being older than age 60. °· Being female. °· Having certain medical conditions. These may include: °? Liver or kidney disease. °? Stomach infection caused by Helicobacter pylori bacteria. °· Having frequent or severe vomiting. °· Heavy use of alcohol. °· Regular use of aspirin or NSAIDs, such as ibuprofen. °· Taking blood thinners (anticoagulants) or anti-platelet medicines. °What are the signs or symptoms? °Symptoms of this condition include: °· Vomiting blood that may be bright red or the color of coffee grounds. °· Black or maroon-colored stools, or bloody stools. °· Racing heartbeat, weakness, or dizziness. °· Heartburn or pain in your abdomen. °· Trouble  swallowing. °· Weight loss. °· Skin or the white parts of your eyes turning yellow (jaundice). °How is this diagnosed? °This condition may be diagnosed based on: °· Your symptoms and medical history. °· A physical exam. During the exam, your health care provider will check for signs of blood loss, such as low blood pressure and a fast pulse. °· Tests or procedures. These may include: °? Blood tests to check for low blood cell count and other signs of blood loss, and to check clotting ability. °? Blood tests to check your liver and kidney function. °? A chest X-ray to look for a tear in the esophagus. °? Endoscopy. In this procedure, a flexible scope is put down your esophagus and into your stomach or duodenum to look for the source of bleeding. °? Angiogram. This may be done if the source of bleeding is not found during endoscopy. For an angiogram, X-rays are taken after a dye is injected into your bloodstream. °? Nasogastric tube insertion. This is a tube passed through your nose and down into your stomach. It may be connected to a source of gentle suction to see if any blood comes out. °How is this treated? °Treatment for this condition depends on the cause of your bleeding. Active bleeding is treated at the hospital. Treatment may include: °· Getting fluids through an IV inserted into one of your veins. °· Getting blood through an IV (blood transfusion). °· Getting high doses of medicine through the IV to lower stomach acid. This may be done to treat ulcer disease. °· Having endoscopy to treat an area of bleeding with high heat (coagulation), injections, or surgical clips. °· Having a procedure that involves first doing an angiogram and then blocking blood flow to the bleeding site (embolization). °· Stopping or changing some of your regular   medicines for a certain amount of time. °· Having other surgical procedures if initial treatments do not control bleeding. °Follow these instructions at home: °Eating and  drinking °· Follow instructions from your health care provider about eating or drinking restrictions. °· Try to eat foods that have a lot of iron. These may include red meat, shellfish, chicken, eggs, and vegetables such as spinach or broccoli. °· Drink enough fluid to keep your urine pale yellow. °· Do not drink alcohol.   °General instructions °· Do not use any products that contain nicotine or tobacco, such as cigarettes, e-cigarettes, and chewing tobacco. If you need help quitting, ask your health care provider. °· Take over-the-counter and prescription medicines only as told by your health care provider. You may need to avoid aspirin, NSAIDs, or other medicines that increase bleeding. °· Return to your normal activities as told by your health care provider. Ask your health care provider what activities are safe for you. °· Keep all follow-up visits as told by your health care provider. This is important.   °Contact a health care provider if: °· You have heartburn or pain in your abdomen. °· You lose weight without trying. °· You have trouble swallowing. °· You vomit often. °· You feel weak or dizzy. °· You need help to stop smoking or drinking alcohol. °Get help right away if: °· You vomit blood. °· You faint. °· You have blood in your stools. °· You develop jaundice. °· You have severe cramps in your back or abdomen. °· Your symptoms of upper GI bleeding come back after treatment. °These symptoms may represent a serious problem that is an emergency. Do not wait to see if the symptoms will go away. Get medical help right away. Call your local emergency services (911 in the [place] Do not drive yourself to the hospital. °Summary °· If you have upper gastrointestinal (GI) bleeding, you may vomit blood or have bloody or black stools (feces). °· Follow instructions from your health care provider about eating or drinking restrictions. °· Do not drink alcohol. Do not use any products that contain nicotine or tobacco,  such as cigarettes, e-cigarettes, and chewing tobacco. °· Take over-the-counter and prescription medicines only as told by your health care provider. You may need to avoid NSAIDs or other medicines that increase bleeding. °This information is not intended to replace advice given to you by your health care provider. Make sure you discuss any questions you have with your health care provider. °Document Revised: 06/13/2019 Document Reviewed: 06/13/2019 °Elsevier Patient Education © 2021 [employer] Inc. ° °

## 2020-10-26 LAB — H PYLORI, IGM, IGG, IGA AB
H Pylori IgG: 0.24 Index Value (ref 0.00–0.79)
H. Pylogi, Iga Abs: <9 units (ref 0.0–8.9)
H. Pylogi, Igm Abs: <9 units (ref 0.0–8.9)

## 2021-02-16 ENCOUNTER — Other Ambulatory Visit: Payer: Self-pay

## 2021-02-16 ENCOUNTER — Non-Acute Institutional Stay (HOSPITAL_COMMUNITY)
Admission: RE | Admit: 2021-02-16 | Discharge: 2021-02-16 | Disposition: A | Discharge status: Home / Self Care | Payer: 59 | Source: Ambulatory Visit | Attending: Internal Medicine | Admitting: Internal Medicine

## 2021-02-16 DIAGNOSIS — O99019 Anemia complicating pregnancy, unspecified trimester: Secondary | ICD-10-CM | POA: Insufficient documentation

## 2021-02-16 MED ORDER — ACETAMINOPHEN 500 MG PO TABS
1000.0000 mg | ORAL_TABLET | Freq: Once | ORAL | Status: AC
  Administered 2021-02-16: 1000 mg via ORAL
  Filled 2021-02-16: qty 2

## 2021-02-16 MED ORDER — SODIUM CHLORIDE 0.9 % IV SOLN
500.0000 mg | Freq: Once | INTRAVENOUS | Status: AC
  Administered 2021-02-16: 500 mg via INTRAVENOUS
  Filled 2021-02-16: qty 25

## 2021-02-16 MED ORDER — SODIUM CHLORIDE 0.9 % IV SOLN
INTRAVENOUS | Status: DC | PRN
  Administered 2021-02-16: 250 mL via INTRAVENOUS

## 2021-02-16 MED ORDER — DIPHENHYDRAMINE HCL 25 MG PO CAPS
25.0000 mg | ORAL_CAPSULE | Freq: Once | ORAL | Status: AC
  Administered 2021-02-16: 25 mg via ORAL
  Filled 2021-02-16: qty 1

## 2021-02-16 NOTE — Progress Notes (Signed)
Patient received IV Venofer '500mg'$  ( dose 1 of 2) as ordered by Rosea Shellia Cleverly MD. Spoke with Marylin Crosby at [facility] who clarified with on call MD Guadalupe Maple , to premedicate patient with Tylenol and Benadryl PO.  Tolerated well, vitals stable, pt voices mild pain at IV site after infusion completed, warm pack placed to site and pt states tenderness is resolving, site c/d/I with no swelling noted.  Discharge instructions given , pt scheduled to RTC for second iron infusion on 9/2 at 9:00am verbalized understanding. Patient alert, oriented, and ambulatory at the time of discharge.

## 2021-03-03 ENCOUNTER — Other Ambulatory Visit: Payer: Self-pay

## 2021-03-03 ENCOUNTER — Non-Acute Institutional Stay (HOSPITAL_COMMUNITY)
Admission: RE | Admit: 2021-03-03 | Discharge: 2021-03-03 | Disposition: A | Discharge status: Home / Self Care | Payer: 59 | Source: Ambulatory Visit | Attending: Internal Medicine | Admitting: Internal Medicine

## 2021-03-03 DIAGNOSIS — O99019 Anemia complicating pregnancy, unspecified trimester: Secondary | ICD-10-CM | POA: Insufficient documentation

## 2021-03-03 MED ORDER — SODIUM CHLORIDE 0.9 % IV SOLN
500.0000 mg | Freq: Once | INTRAVENOUS | Status: AC
  Administered 2021-03-03: 500 mg via INTRAVENOUS
  Filled 2021-03-03: qty 25

## 2021-03-03 MED ORDER — SODIUM CHLORIDE 0.9 % IV SOLN
INTRAVENOUS | Status: DC | PRN
  Administered 2021-03-03: 250 mL via INTRAVENOUS

## 2021-03-03 MED ORDER — DIPHENHYDRAMINE HCL 25 MG PO CAPS
25.0000 mg | ORAL_CAPSULE | Freq: Once | ORAL | Status: AC
  Administered 2021-03-03: 25 mg via ORAL
  Filled 2021-03-03: qty 1

## 2021-03-03 MED ORDER — ACETAMINOPHEN 500 MG PO TABS
1000.0000 mg | ORAL_TABLET | Freq: Once | ORAL | Status: AC
  Administered 2021-03-03: 1000 mg via ORAL
  Filled 2021-03-03: qty 2

## 2021-03-03 NOTE — Progress Notes (Signed)
PATIENT CARE CENTER NOTE    Diagnosis: Anemia complicating pregnancy, unspecified trimester   Provider: Lyda Kalata, MD   Procedure: Venofer infusion    Note:  Patient received Venofer infusion (2 of 2) via PIV.  Pre-medications given per order (Tylenol and Benadryl). Patient tolerated infusion well. At completion of infusion, patient reported an achy pain in her hand and wrist around the IV site. Patient experienced the same pain with previous iron infusion. Heat pack placed over site and pain began to subside. Vital signs stable. AVS offered but patient refused. Patient alert, oriented and ambulatory at discharge.

## 2021-03-13 ENCOUNTER — Encounter (HOSPITAL_COMMUNITY): Payer: Self-pay

## 2021-03-13 ENCOUNTER — Telehealth (HOSPITAL_COMMUNITY): Payer: Self-pay | Admitting: *Deleted

## 2021-03-13 NOTE — Telephone Encounter (Signed)
Preadmission screen  

## 2021-03-13 NOTE — Patient Instructions (Addendum)
Cate T Neuman  03/13/2021   Your procedure is scheduled on:  03/27/2021  Arrive at 39 at Entrance C on [address] at [facility]  and [address]. You are invited to use the FREE valet parking or use the Visitor's parking deck.  Pick up the phone at the desk and dial [phone].  Call this number if you have problems the morning of surgery: [phone]  Remember:   Do not eat food:(After Midnight) Desps de medianoche.  Do not drink clear liquids: (After Midnight) Desps de medianoche.  Take these medicines the morning of surgery with A SIP OF WATER  NONE   Do not wear jewelry, make-up or nail polish.  Do not wear lotions, powders, or perfumes. Do not wear deodorant.  Do not shave 48 hours prior to surgery.  Do not bring valuables to the hospital.  [facility] is not   responsible for any belongings or valuables brought to the hospital.  Contacts, dentures or bridgework may not be worn into surgery.  Leave suitcase in the car. After surgery it may be brought to your room.  For patients admitted to the hospital, checkout time is 11:00 AM the day of              discharge.      Please read over the following fact sheets that you were given:     Preparing for Surgery

## 2021-03-24 ENCOUNTER — Other Ambulatory Visit: Payer: Self-pay

## 2021-03-24 ENCOUNTER — Other Ambulatory Visit: Payer: Self-pay | Admitting: Obstetrics

## 2021-03-24 ENCOUNTER — Encounter (HOSPITAL_COMMUNITY)
Admission: RE | Admit: 2021-03-24 | Discharge: 2021-03-24 | Disposition: A | Discharge status: Home / Self Care | Payer: 59 | Source: Ambulatory Visit | Attending: Obstetrics | Admitting: Obstetrics

## 2021-03-24 DIAGNOSIS — Z20822 Contact with and (suspected) exposure to covid-19: Secondary | ICD-10-CM | POA: Insufficient documentation

## 2021-03-24 DIAGNOSIS — Z01812 Encounter for preprocedural laboratory examination: Secondary | ICD-10-CM | POA: Insufficient documentation

## 2021-03-24 HISTORY — DX: Peptic ulcer, site unspecified, unspecified as acute or chronic, without hemorrhage or perforation: K27.9

## 2021-03-24 HISTORY — DX: Anemia, unspecified: D64.9

## 2021-03-24 LAB — CBC
HCT: 37.3 % (ref 36.0–46.0)
Hemoglobin: 11.7 g/dL — ABNORMAL LOW (ref 12.0–15.0)
MCH: 27.5 pg (ref 26.0–34.0)
MCHC: 31.4 g/dL (ref 30.0–36.0)
MCV: 87.6 fL (ref 80.0–100.0)
Platelets: 209 10*3/uL (ref 150–400)
RBC: 4.26 MIL/uL (ref 3.87–5.11)
RDW: 20.5 % — ABNORMAL HIGH (ref 11.5–15.5)
WBC: 9.1 10*3/uL (ref 4.0–10.5)
nRBC: 0 % (ref 0.0–0.2)

## 2021-03-24 LAB — TYPE AND SCREEN
ABO/RH(D): A POS
Antibody Screen: NEGATIVE

## 2021-03-24 LAB — RPR: RPR Ser Ql: NONREACTIVE

## 2021-03-25 LAB — SARS CORONAVIRUS 2 (TAT 6-24 HRS): SARS Coronavirus 2: NEGATIVE

## 2021-03-27 ENCOUNTER — Inpatient Hospital Stay (HOSPITAL_COMMUNITY): Payer: 59 | Admitting: Anesthesiology

## 2021-03-27 ENCOUNTER — Other Ambulatory Visit: Payer: Self-pay

## 2021-03-27 ENCOUNTER — Encounter (HOSPITAL_COMMUNITY): Payer: Self-pay | Admitting: Obstetrics

## 2021-03-27 ENCOUNTER — Encounter (HOSPITAL_COMMUNITY)
Admission: RE | Disposition: A | Discharge status: Home / Self Care | Payer: Self-pay | Source: Home / Self Care | Attending: Obstetrics

## 2021-03-27 ENCOUNTER — Inpatient Hospital Stay (HOSPITAL_COMMUNITY)
Admission: RE | Admit: 2021-03-27 | Discharge: 2021-03-29 | DRG: 784 | Disposition: A | Discharge status: Home / Self Care | Payer: 59 | Attending: Obstetrics | Admitting: Obstetrics

## 2021-03-27 DIAGNOSIS — Z302 Encounter for sterilization: Secondary | ICD-10-CM | POA: Diagnosis not present

## 2021-03-27 DIAGNOSIS — K921 Melena: Secondary | ICD-10-CM | POA: Diagnosis present

## 2021-03-27 DIAGNOSIS — O99214 Obesity complicating childbirth: Secondary | ICD-10-CM | POA: Diagnosis present

## 2021-03-27 DIAGNOSIS — O9962 Diseases of the digestive system complicating childbirth: Secondary | ICD-10-CM | POA: Diagnosis present

## 2021-03-27 DIAGNOSIS — Z98891 History of uterine scar from previous surgery: Secondary | ICD-10-CM

## 2021-03-27 DIAGNOSIS — O34211 Maternal care for low transverse scar from previous cesarean delivery: Secondary | ICD-10-CM | POA: Diagnosis present

## 2021-03-27 DIAGNOSIS — Z3A39 39 weeks gestation of pregnancy: Secondary | ICD-10-CM | POA: Diagnosis not present

## 2021-03-27 DIAGNOSIS — K219 Gastro-esophageal reflux disease without esophagitis: Secondary | ICD-10-CM | POA: Diagnosis present

## 2021-03-27 DIAGNOSIS — O99892 Other specified diseases and conditions complicating childbirth: Secondary | ICD-10-CM | POA: Diagnosis present

## 2021-03-27 HISTORY — DX: History of uterine scar from previous surgery: Z98.891

## 2021-03-27 SURGERY — Surgical Case
Anesthesia: Spinal

## 2021-03-27 MED ORDER — SCOPOLAMINE 1 MG/3DAYS TD PT72
MEDICATED_PATCH | TRANSDERMAL | Status: AC
  Filled 2021-03-27: qty 1

## 2021-03-27 MED ORDER — SENNOSIDES-DOCUSATE SODIUM 8.6-50 MG PO TABS
2.0000 | ORAL_TABLET | ORAL | Status: DC
  Administered 2021-03-28: 2 via ORAL
  Filled 2021-03-27: qty 2

## 2021-03-27 MED ORDER — MORPHINE SULFATE (PF) 0.5 MG/ML IJ SOLN
INTRAMUSCULAR | Status: DC | PRN
  Administered 2021-03-27: 150 ug via INTRATHECAL

## 2021-03-27 MED ORDER — CEFAZOLIN SODIUM-DEXTROSE 2-4 GM/100ML-% IV SOLN
2.0000 g | INTRAVENOUS | Status: AC
  Administered 2021-03-27: 2 g via INTRAVENOUS

## 2021-03-27 MED ORDER — OXYCODONE HCL 5 MG/5ML PO SOLN: 5.0000 mg | Freq: Once | ORAL | Status: DC | PRN

## 2021-03-27 MED ORDER — PHENYLEPHRINE HCL-NACL 20-0.9 MG/250ML-% IV SOLN
INTRAVENOUS | Status: DC | PRN
  Administered 2021-03-27: 80 ug/min via INTRAVENOUS

## 2021-03-27 MED ORDER — SCOPOLAMINE 1 MG/3DAYS TD PT72: 1.0000 | MEDICATED_PATCH | Freq: Once | TRANSDERMAL | Status: DC

## 2021-03-27 MED ORDER — DEXAMETHASONE SODIUM PHOSPHATE 4 MG/ML IJ SOLN
INTRAMUSCULAR | Status: DC | PRN
  Administered 2021-03-27: 10 mg via INTRAVENOUS

## 2021-03-27 MED ORDER — CHLOROPROCAINE HCL (PF) 3 % IJ SOLN
INTRAMUSCULAR | Status: DC | PRN
  Administered 2021-03-27: 20 mL

## 2021-03-27 MED ORDER — NALOXONE HCL 4 MG/10ML IJ SOLN: 1.0000 ug/kg/h | INTRAVENOUS | Status: DC | PRN

## 2021-03-27 MED ORDER — FENTANYL CITRATE (PF) 100 MCG/2ML IJ SOLN
INTRAMUSCULAR | Status: DC | PRN
  Administered 2021-03-27: 15 ug via INTRATHECAL

## 2021-03-27 MED ORDER — COCONUT OIL OIL: 1.0000 "application " | TOPICAL_OIL | Status: DC | PRN

## 2021-03-27 MED ORDER — NALBUPHINE HCL 10 MG/ML IJ SOLN: 5.0000 mg | Freq: Once | INTRAMUSCULAR | Status: DC | PRN

## 2021-03-27 MED ORDER — OXYTOCIN-SODIUM CHLORIDE 30-0.9 UT/500ML-% IV SOLN
INTRAVENOUS | Status: AC
  Filled 2021-03-27: qty 500

## 2021-03-27 MED ORDER — OXYTOCIN-SODIUM CHLORIDE 30-0.9 UT/500ML-% IV SOLN
2.5000 [IU]/h | INTRAVENOUS | Status: AC
  Administered 2021-03-27: 2.5 [IU]/h via INTRAVENOUS
  Filled 2021-03-27: qty 500

## 2021-03-27 MED ORDER — NALBUPHINE HCL 10 MG/ML IJ SOLN
5.0000 mg | INTRAMUSCULAR | Status: DC | PRN
Start: 2021-03-27 — End: 2021-03-29

## 2021-03-27 MED ORDER — DEXAMETHASONE SODIUM PHOSPHATE 10 MG/ML IJ SOLN
INTRAMUSCULAR | Status: AC
  Filled 2021-03-27: qty 1

## 2021-03-27 MED ORDER — ACETAMINOPHEN 500 MG PO TABS
1000.0000 mg | ORAL_TABLET | Freq: Four times a day (QID) | ORAL | Status: DC
  Administered 2021-03-27 – 2021-03-29 (×6): 1000 mg via ORAL
  Filled 2021-03-27 (×6): qty 2

## 2021-03-27 MED ORDER — LACTATED RINGERS IV SOLN: INTRAVENOUS | Status: DC

## 2021-03-27 MED ORDER — DIPHENHYDRAMINE HCL 50 MG/ML IJ SOLN: 12.5000 mg | INTRAMUSCULAR | Status: DC | PRN

## 2021-03-27 MED ORDER — OXYTOCIN-SODIUM CHLORIDE 30-0.9 UT/500ML-% IV SOLN
INTRAVENOUS | Status: DC | PRN
  Administered 2021-03-27: 30 [IU] via INTRAVENOUS

## 2021-03-27 MED ORDER — FENTANYL CITRATE (PF) 100 MCG/2ML IJ SOLN: 25.0000 ug | INTRAMUSCULAR | Status: DC | PRN

## 2021-03-27 MED ORDER — KETOROLAC TROMETHAMINE 30 MG/ML IJ SOLN: 30.0000 mg | Freq: Once | INTRAMUSCULAR | Status: DC | PRN

## 2021-03-27 MED ORDER — CEFAZOLIN SODIUM-DEXTROSE 2-4 GM/100ML-% IV SOLN
INTRAVENOUS | Status: AC
  Filled 2021-03-27: qty 100

## 2021-03-27 MED ORDER — SODIUM CHLORIDE 0.9% FLUSH: 3.0000 mL | INTRAVENOUS | Status: DC | PRN

## 2021-03-27 MED ORDER — KETOROLAC TROMETHAMINE 30 MG/ML IJ SOLN
INTRAMUSCULAR | Status: AC
  Filled 2021-03-27: qty 1

## 2021-03-27 MED ORDER — NALOXONE HCL 0.4 MG/ML IJ SOLN: 0.4000 mg | INTRAMUSCULAR | Status: DC | PRN

## 2021-03-27 MED ORDER — MEASLES, MUMPS & RUBELLA VAC IJ SOLR: 0.5000 mL | Freq: Once | INTRAMUSCULAR | Status: DC

## 2021-03-27 MED ORDER — ONDANSETRON HCL 4 MG/2ML IJ SOLN
INTRAMUSCULAR | Status: AC
  Filled 2021-03-27: qty 2

## 2021-03-27 MED ORDER — BUPIVACAINE IN DEXTROSE 0.75-8.25 % IT SOLN
INTRATHECAL | Status: DC | PRN
  Administered 2021-03-27: 1.6 mL via INTRATHECAL

## 2021-03-27 MED ORDER — CHLOROPROCAINE HCL (PF) 3 % IJ SOLN
INTRAMUSCULAR | Status: AC
  Filled 2021-03-27: qty 20

## 2021-03-27 MED ORDER — OXYCODONE HCL 5 MG PO TABS: 5.0000 mg | ORAL_TABLET | ORAL | Status: DC | PRN

## 2021-03-27 MED ORDER — HYDROMORPHONE HCL 1 MG/ML IJ SOLN: 0.2500 mg | INTRAMUSCULAR | Status: DC | PRN

## 2021-03-27 MED ORDER — KETOROLAC TROMETHAMINE 30 MG/ML IJ SOLN: 30.0000 mg | Freq: Four times a day (QID) | INTRAMUSCULAR | Status: DC | PRN

## 2021-03-27 MED ORDER — ONDANSETRON HCL 4 MG/2ML IJ SOLN: 4.0000 mg | Freq: Three times a day (TID) | INTRAMUSCULAR | Status: DC | PRN

## 2021-03-27 MED ORDER — SIMETHICONE 80 MG PO CHEW
80.0000 mg | CHEWABLE_TABLET | Freq: Three times a day (TID) | ORAL | Status: DC
  Administered 2021-03-27 – 2021-03-29 (×4): 80 mg via ORAL
  Filled 2021-03-27 (×5): qty 1

## 2021-03-27 MED ORDER — KETOROLAC TROMETHAMINE 30 MG/ML IJ SOLN
30.0000 mg | Freq: Four times a day (QID) | INTRAMUSCULAR | Status: AC
  Administered 2021-03-27 – 2021-03-28 (×2): 30 mg via INTRAVENOUS
  Filled 2021-03-27 (×2): qty 1

## 2021-03-27 MED ORDER — ONDANSETRON HCL 4 MG/2ML IJ SOLN
INTRAMUSCULAR | Status: DC | PRN
  Administered 2021-03-27: 4 mg via INTRAVENOUS

## 2021-03-27 MED ORDER — LIDOCAINE HCL 1 % IJ SOLN
INTRAMUSCULAR | Status: AC
  Filled 2021-03-27: qty 20

## 2021-03-27 MED ORDER — MEPERIDINE HCL 25 MG/ML IJ SOLN: 6.2500 mg | INTRAMUSCULAR | Status: DC | PRN

## 2021-03-27 MED ORDER — ACETAMINOPHEN 10 MG/ML IV SOLN
INTRAVENOUS | Status: AC
  Filled 2021-03-27: qty 100

## 2021-03-27 MED ORDER — NALBUPHINE HCL 10 MG/ML IJ SOLN: 5.0000 mg | INTRAMUSCULAR | Status: DC | PRN

## 2021-03-27 MED ORDER — FENTANYL CITRATE (PF) 100 MCG/2ML IJ SOLN
INTRAMUSCULAR | Status: AC
  Filled 2021-03-27: qty 2

## 2021-03-27 MED ORDER — PHENYLEPHRINE 40 MCG/ML (10ML) SYRINGE FOR IV PUSH (FOR BLOOD PRESSURE SUPPORT)
PREFILLED_SYRINGE | INTRAVENOUS | Status: AC
  Filled 2021-03-27: qty 30

## 2021-03-27 MED ORDER — OXYCODONE HCL 5 MG PO TABS
5.0000 mg | ORAL_TABLET | Freq: Once | ORAL | Status: DC | PRN
Start: 2021-03-27 — End: 2021-03-27

## 2021-03-27 MED ORDER — MENTHOL 3 MG MT LOZG: 1.0000 | LOZENGE | OROMUCOSAL | Status: DC | PRN

## 2021-03-27 MED ORDER — PRENATAL MULTIVITAMIN CH
1.0000 | ORAL_TABLET | Freq: Every day | ORAL | Status: DC
  Filled 2021-03-27: qty 1

## 2021-03-27 MED ORDER — DIPHENHYDRAMINE HCL 25 MG PO CAPS: 25.0000 mg | ORAL_CAPSULE | ORAL | Status: DC | PRN

## 2021-03-27 MED ORDER — NALOXONE HCL 4 MG/10ML IJ SOLN
1.0000 ug/kg/h | INTRAVENOUS | Status: DC | PRN
  Filled 2021-03-27: qty 5

## 2021-03-27 MED ORDER — SCOPOLAMINE 1 MG/3DAYS TD PT72
1.0000 | MEDICATED_PATCH | Freq: Once | TRANSDERMAL | Status: DC
  Administered 2021-03-27: 1.5 mg via TRANSDERMAL

## 2021-03-27 MED ORDER — KETOROLAC TROMETHAMINE 30 MG/ML IJ SOLN
30.0000 mg | Freq: Four times a day (QID) | INTRAMUSCULAR | Status: DC | PRN
  Administered 2021-03-27: 30 mg via INTRAVENOUS

## 2021-03-27 MED ORDER — ONDANSETRON HCL 4 MG/2ML IJ SOLN: 4.0000 mg | Freq: Once | INTRAMUSCULAR | Status: DC | PRN

## 2021-03-27 MED ORDER — PANTOPRAZOLE SODIUM 40 MG PO TBEC
40.0000 mg | DELAYED_RELEASE_TABLET | Freq: Every day | ORAL | Status: DC
  Administered 2021-03-27 – 2021-03-28 (×2): 40 mg via ORAL
  Filled 2021-03-27 (×2): qty 1

## 2021-03-27 MED ORDER — PROMETHAZINE HCL 25 MG/ML IJ SOLN: 6.2500 mg | INTRAMUSCULAR | Status: DC | PRN

## 2021-03-27 MED ORDER — PHENYLEPHRINE HCL-NACL 20-0.9 MG/250ML-% IV SOLN
INTRAVENOUS | Status: AC
  Filled 2021-03-27: qty 250

## 2021-03-27 MED ORDER — IBUPROFEN 600 MG PO TABS
600.0000 mg | ORAL_TABLET | Freq: Four times a day (QID) | ORAL | Status: DC
  Administered 2021-03-28 – 2021-03-29 (×4): 600 mg via ORAL
  Filled 2021-03-27 (×5): qty 1

## 2021-03-27 MED ORDER — DIBUCAINE (PERIANAL) 1 % EX OINT: 1.0000 "application " | TOPICAL_OINTMENT | CUTANEOUS | Status: DC | PRN

## 2021-03-27 MED ORDER — DIPHENHYDRAMINE HCL 25 MG PO CAPS: 25.0000 mg | ORAL_CAPSULE | Freq: Four times a day (QID) | ORAL | Status: DC | PRN

## 2021-03-27 MED ORDER — ACETAMINOPHEN 500 MG PO TABS: 1000.0000 mg | ORAL_TABLET | Freq: Four times a day (QID) | ORAL | Status: DC

## 2021-03-27 MED ORDER — MORPHINE SULFATE (PF) 0.5 MG/ML IJ SOLN
INTRAMUSCULAR | Status: AC
  Filled 2021-03-27: qty 10

## 2021-03-27 MED ORDER — WITCH HAZEL-GLYCERIN EX PADS: 1.0000 "application " | MEDICATED_PAD | CUTANEOUS | Status: DC | PRN

## 2021-03-27 MED ORDER — SIMETHICONE 80 MG PO CHEW: 80.0000 mg | CHEWABLE_TABLET | ORAL | Status: DC | PRN

## 2021-03-27 MED ORDER — ACETAMINOPHEN 10 MG/ML IV SOLN
INTRAVENOUS | Status: DC | PRN
  Administered 2021-03-27: 1000 mg via INTRAVENOUS

## 2021-03-27 MED ORDER — POVIDONE-IODINE 10 % EX SWAB: 2.0000 "application " | Freq: Once | CUTANEOUS | Status: DC

## 2021-03-27 SURGICAL SUPPLY — 34 items
BENZOIN TINCTURE PRP APPL 2/3 (GAUZE/BANDAGES/DRESSINGS) ×2 IMPLANT
CLAMP CORD UMBIL (MISCELLANEOUS) IMPLANT
CLOTH BEACON ORANGE TIMEOUT ST (SAFETY) ×2 IMPLANT
DRSG OPSITE POSTOP 4X10 (GAUZE/BANDAGES/DRESSINGS) ×2 IMPLANT
ELECT REM PT RETURN 9FT ADLT (ELECTROSURGICAL) ×2
ELECTRODE REM PT RTRN 9FT ADLT (ELECTROSURGICAL) ×1 IMPLANT
EXTRACTOR VACUUM KIWI (MISCELLANEOUS) IMPLANT
GLOVE BIOGEL PI IND STRL 6.5 (GLOVE) ×1 IMPLANT
GLOVE BIOGEL PI IND STRL 7.0 (GLOVE) ×1 IMPLANT
GLOVE BIOGEL PI INDICATOR 6.5 (GLOVE) ×1
GLOVE BIOGEL PI INDICATOR 7.0 (GLOVE) ×1
GLOVE ECLIPSE 6.0 STRL STRAW (GLOVE) ×2 IMPLANT
GOWN STRL REUS W/TWL LRG LVL3 (GOWN DISPOSABLE) ×4 IMPLANT
KIT ABG SYR 3ML LUER SLIP (SYRINGE) IMPLANT
NEEDLE HYPO 25X5/8 SAFETYGLIDE (NEEDLE) IMPLANT
NS IRRIG 1000ML POUR BTL (IV SOLUTION) ×2 IMPLANT
PACK C SECTION WH (CUSTOM PROCEDURE TRAY) ×2 IMPLANT
PAD OB MATERNITY 4.3X12.25 (PERSONAL CARE ITEMS) ×2 IMPLANT
PENCIL SMOKE EVAC W/HOLSTER (ELECTROSURGICAL) ×2 IMPLANT
RTRCTR C-SECT PINK 25CM LRG (MISCELLANEOUS) ×2 IMPLANT
STRIP CLOSURE SKIN 1/2X4 (GAUZE/BANDAGES/DRESSINGS) ×2 IMPLANT
SUT MNCRL 0 VIOLET CTX 36 (SUTURE) ×2 IMPLANT
SUT MNCRL AB 3-0 PS2 27 (SUTURE) ×2 IMPLANT
SUT MONOCRYL 0 CTX 36 (SUTURE) ×2
SUT PLAIN 0 NONE (SUTURE) IMPLANT
SUT PLAIN 2 0 (SUTURE) ×1
SUT PLAIN ABS 2-0 CT1 27XMFL (SUTURE) ×1 IMPLANT
SUT VIC AB 0 CTX 36 (SUTURE) ×2
SUT VIC AB 0 CTX36XBRD ANBCTRL (SUTURE) ×2 IMPLANT
SUT VIC AB 2-0 CT1 27 (SUTURE) ×1
SUT VIC AB 2-0 CT1 TAPERPNT 27 (SUTURE) ×1 IMPLANT
TOWEL OR 17X24 6PK STRL BLUE (TOWEL DISPOSABLE) ×2 IMPLANT
TRAY FOLEY W/BAG SLVR 14FR LF (SET/KITS/TRAYS/PACK) ×2 IMPLANT
WATER STERILE IRR 1000ML POUR (IV SOLUTION) ×2 IMPLANT

## 2021-03-27 NOTE — Op Note (Signed)
Cesarean Section Procedure Note  Pre-operative Diagnosis: 1. Intrauterine pregnancy at [redacted]w[redacted]d  2. History of cesarean section, desires repeat 3. Desires permanent sterilization  Post-operative Diagnosis: same as above  Surgeon: Jerelyn Charles, MD  Assistants: Irene Pap, MD  Procedure: Repeat low transverse cesarean section with parkland bilateral tubal ligation  Anesthesia: Spinal anesthesia  Estimated Blood Loss: 271 mL         Drains: Foley catheter         Specimens: placenta to pathology         Complications:  None; patient tolerated the procedure well.         Disposition: PACU - hemodynamically stable.  Findings:  Normal uterus, tubes and ovaries bilaterally.  Viable female infant, 3600g (7lb 15oz) Apgars 8, 9.    Procedure Details    After spinal anesthesia was found to adequate , the patient was placed in the dorsal supine position with a leftward tilt, prepped and draped in the usual sterile manner. A Pfannenstiel incision was made and carried down through the subcutaneous tissue to the fascia.  The fascia was incised in the midline and the fascial incision was extended laterally with Mayo scissors. The superior aspect of the fascial incision was grasped with two Kocher clamp, tented up and the rectus muscles dissected off bluntly. The rectus was then dissected off with blunt dissection and Mayo scissors. The rectus muscles were separated in the midline. The abdominal peritoneum was identified, tented up, entered sharply, and the incision was extended superiorly and inferiorly with good visualization of the bladder.  The Alexis retractor was deployed and the bladder flap was created. A scalpel was then used to make a low transverse incision on the uterus which was extended laterally with blunt dissection. The fluid was clear.  The fetal vertex was identified, elevated out of the pelvis and brought to the hysterotomy.  The head was delivered easily followed by the shoulders  and body.  After a 60 second delay per protocol, the cord was clamped and cut and the infant was passed to the waiting neonatologist.  The placenta was then delivered spontaneously, intact and appear normal, the uterus was cleared of all clot and debris   The hysterotomy was repaired with #0 Monocryl in running locked fashion.  A second imbricating layer of #0 Monocryl was placed.  The left fallopian tube was grasped with two babcock clamps.  An avascular segment of the mesosalpinx was identified, and a parkland tubal ligation was performed in the usual fashion with #0 plain gut suture.  Tubal ostia were identified on both remaining sides of the tube.  This was repeated with the right fallopian tube. The portion of the left and right fallopian tubes were sent to pathology.   Hemostasis was noted at the tubal stumps. The Alexis retractor was removed from the abdomen. The peritoneum was examined and all vessels noted to be hemostatic. The abdominal cavity was cleared of all clot and debris.  The peritoneum was closed with 2-0 vicryl in a running fashion.  The rectus muscles were then closed with 2-0 Vicryl. The fascia and rectus muscles were inspected and were hemostatic. The fascia was closed with 0 Vicryl in a running fashion. The subcutaneous layer was irrigated and all bleeders cauterized. The subcutaneous layer was closed with interrupted plain gut. The skin was closed with 3-0 monocryl in a subcuticular fashion. The incision was dressed with benzoine, steri strips and honeycomb dressing. All sponge lap and needle counts were correct x3.  Patient tolerated the procedure well and recovered in stable condition following the procedure

## 2021-03-27 NOTE — Anesthesia Procedure Notes (Signed)
Spinal  Patient location during procedure: OR Start time: 03/27/2021 12:20 PM End time: 03/27/2021 12:45 PM Reason for block: surgical anesthesia Staffing Performed: anesthesiologist  Anesthesiologist: Darral Dash, DO Preanesthetic Checklist Completed: patient identified, IV checked, site marked, risks and benefits discussed, surgical consent, monitors and equipment checked, pre-op evaluation and timeout performed Spinal Block Patient position: sitting Prep: DuraPrep Patient monitoring: heart rate, cardiac monitor, continuous pulse ox and blood pressure Approach: midline Location: L3-4 Injection technique: single-shot Needle Needle type: Pencan  Needle gauge: 24 G Needle length: 10 cm Assessment Sensory level: T4 Events: CSF return and second provider Additional Notes - Difficult placement by primary provider self despite multiple position adjustments and needle changes. Josephine Igo, MD in for second provider attempt with success. +CSF, -heme.   Patient identified. Risks/Benefits/Options discussed with patient including but not limited to bleeding, infection, nerve damage, paralysis, failed block, incomplete pain control, headache, blood pressure changes, nausea, vomiting, reactions to medications, itching and postpartum back pain. Confirmed with bedside nurse the patient's most recent platelet count. Confirmed with patient that they are not currently taking any anticoagulation, have any bleeding history or any family history of bleeding disorders. Patient expressed understanding and wished to proceed. All questions were answered. Sterile technique was used throughout the entire procedure. Please see nursing notes for vital signs. Warning signs of high block given to the patient including shortness of breath, tingling/numbness in hands, complete motor block, or any concerning symptoms with instructions to call for help. Patient was given instructions on fall risk and not to get  out of bed. All questions and concerns addressed with instructions to call with any issues or inadequate analgesia.

## 2021-03-27 NOTE — Anesthesia Preprocedure Evaluation (Addendum)
Anesthesia Evaluation  Patient identified by MRN, date of birth, ID band Patient awake    Reviewed: Allergy & Precautions, Patient's Chart, lab work & pertinent test results  Airway Mallampati: II  TM Distance: >3 FB Neck ROM: Full    Dental  (+) Teeth Intact   Pulmonary neg pulmonary ROS,    Pulmonary exam normal        Cardiovascular negative cardio ROS   Rhythm:Regular Rate:Normal     Neuro/Psych PSYCHIATRIC DISORDERS Anxiety negative neurological ROS     GI/Hepatic Neg liver ROS, PUD, GERD  Controlled and Medicated,  Endo/Other  Obesity BMI 33  Renal/GU negative Renal ROS  negative genitourinary   Musculoskeletal negative musculoskeletal ROS (+)   Abdominal (+)  Abdomen: soft.    Peds  Hematology hct 37.3, plt 209   Anesthesia Other Findings   Reproductive/Obstetrics (+) Pregnancy 1 prior section 2020                            Anesthesia Physical Anesthesia Plan  ASA: 2  Anesthesia Plan: Spinal   Post-op Pain Management:    Induction:   PONV Risk Score and Plan: 3 and Ondansetron, Dexamethasone and Treatment may vary due to age or medical condition  Airway Management Planned: Natural Airway and Nasal Cannula  Additional Equipment: None  Intra-op Plan:   Post-operative Plan:   Informed Consent: I have reviewed the patients History and Physical, chart, labs and discussed the procedure including the risks, benefits and alternatives for the proposed anesthesia with the patient or authorized representative who has indicated his/her understanding and acceptance.     Dental advisory given  Plan Discussed with: CRNA  Anesthesia Plan Comments: (Lab Results      Component                Value               Date                      WBC                      9.1                 03/24/2021                HGB                      11.7 (L)            03/24/2021                 HCT                      37.3                03/24/2021                MCV                      87.6                03/24/2021                PLT                      209  03/24/2021          )       Anesthesia Quick Evaluation

## 2021-03-27 NOTE — Transfer of Care (Signed)
Immediate Anesthesia Transfer of Care Note  Patient: [name]  Procedure(s) Performed: REPEAT CESAREAN SECTION WITH BILATERAL TUBAL LIGATION  Patient Location: PACU  Anesthesia Type:Spinal  Level of Consciousness: awake  Airway & Oxygen Therapy: Patient Spontanous Breathing  Post-op Assessment: Report given to RN and Post -op Vital signs reviewed and stable  Post vital signs: Reviewed and stable  Last Vitals:  Vitals Value Taken Time  BP 103/66 03/27/21 1415  Temp 36.1 C 03/27/21 1409  Pulse 63 03/27/21 1423  Resp 17 03/27/21 1423  SpO2 100 % 03/27/21 1423  Vitals shown include unvalidated device data.  Last Pain:  Vitals:   03/27/21 1409  TempSrc: Axillary      Patients Stated Pain Goal: 0 (39/17/92 1783)  Complications: No notable events documented.

## 2021-03-27 NOTE — Anesthesia Postprocedure Evaluation (Signed)
Anesthesia Post Note  Patient: [name]  Procedure(s) Performed: REPEAT CESAREAN SECTION WITH BILATERAL TUBAL LIGATION     Patient location during evaluation: Mother Baby Anesthesia Type: Spinal Level of consciousness: oriented and awake and alert Pain management: pain level controlled Vital Signs Assessment: post-procedure vital signs reviewed and stable Respiratory status: spontaneous breathing and respiratory function stable Cardiovascular status: blood pressure returned to baseline and stable Postop Assessment: no headache, no backache, no apparent nausea or vomiting and able to ambulate Anesthetic complications: no   No notable events documented.  Last Vitals:  Vitals:   03/27/21 1515 03/27/21 1629  BP: (!) 88/54 (!) 86/59  Pulse: 61 65  Resp: 18 18  Temp: 36.8 C 36.6 C  SpO2: 99% 98%    Last Pain:  Vitals:   03/27/21 1629  TempSrc: Oral  PainSc: 2                  Belenda Cruise P Culley Hedeen

## 2021-03-27 NOTE — H&P (Signed)
35 y.o. G2P1001 @ [redacted]w[redacted]d presents for repeat cesarean section and bilateral tubal ligation.  Otherwise has good fetal movement and no bleeding.  Pregnancy complicated by: History of cesarean section for non-reassuring fetal status. Desires repeat Bloody stools during pregnancy:  saw GI after episodes of melena / epigastric pain.  Now on pantoprazole and carafate, neg h.pylori, plan endoscopy after pregnancy. On PO iron for low ferritin, hemoglobin now > 11  Past Medical History:  Diagnosis Date   Anemia    Anxiety    sees a therapist   GERD (gastroesophageal reflux disease)    Peptic ulcer     Past Surgical History:  Procedure Laterality Date   CESAREAN SECTION N/A 07/25/2018   Procedure: CESAREAN SECTION;  Surgeon: Allyn Kenner, DO;  Location: [facility];  Service: Obstetrics;  Laterality: N/A;   LAPAROSCOPIC APPENDECTOMY N/A 11/07/2018   Procedure: APPENDECTOMY LAPAROSCOPIC;  Surgeon: Erroll Luna, MD;  Location: [facility] OR;  Service: General;  Laterality: N/A;   TONSILLECTOMY     WISDOM TOOTH EXTRACTION      OB History  Gravida Para Term Preterm AB Living  2 1 1     1   SAB IAB Ectopic Multiple Live Births        0 1    # Outcome Date GA Lbr Len/2nd Weight Sex Delivery Anes PTL Lv  2 Current           1 Term 07/25/18 [redacted]w[redacted]d  2920 g F CS-LTranv EPI  LIV     Complications: Fetal Intolerance    Social History   Socioeconomic History   Marital status: Married    Spouse name: Tim   Number of children: Not on file   Years of education: Not on file   Highest education level: Not on file  Occupational History   Not on file  Tobacco Use   Smoking status: Never   Smokeless tobacco: Never  Vaping Use   Vaping Use: Never used  Substance and Sexual Activity   Alcohol use: Not Currently    Comment: prior to preg   Drug use: Never   Sexual activity: Yes  Other Topics Concern   Not on file  Social History Narrative   Not on file   Social Determinants of Health    Financial Resource Strain: Not on file  Food Insecurity: Not on file  Transportation Needs: Not on file  Physical Activity: Not on file  Stress: Not on file  Social Connections: Not on file  Intimate Partner Violence: Not on file   Patient has no known allergies.    Prenatal Transfer Tool  Maternal Diabetes: No Genetic Screening: Normal Maternal Ultrasounds/Referrals: Normal Fetal Ultrasounds or other Referrals:  None Maternal Substance Abuse:  No Significant Maternal Medications:  Meds include: Other:  Protonix Significant Maternal Lab Results: Group B Strep negative  ABO, Rh: --/--/A POS (09/23 0935) Antibody: NEG (09/23 0935) Rubella: Nonimmune (03/10 0000) RPR: NON REACTIVE (09/23 0938)  HBsAg: Negative (03/10 0000)  HIV: Non-reactive (03/10 0000)  GBS:   GBS negative      Vitals:   03/27/21 1058  BP: 116/71  Pulse: 88  Resp: 18  Temp: 98 F (36.7 C)  SpO2: 98%     General:  NAD Abdomen:  soft, gravid FHTs:  136    A/P   35 y.o. G2P1001 [redacted]w[redacted]d presents for repeat cesarean section and bilateral tubal liation.    Discussed risks of cesarean section to include, but not limited to, infection, bleeding,  damage to surrounding strutcures (including bowel, bladder, tubes, ovaries, nerves, vessels, baby), need for additional procedures, risk of blood clot, need for transfusion, tubal failure resulting in pregnancy, including risk of ectopic pregnancy, need for additional procedures, risk of regret.  All questions answered and consent signed Ancef 2gm on call to [place]

## 2021-03-27 NOTE — Lactation Note (Addendum)
This note was copied from a baby's chart. Lactation Consultation Note  Patient Name: [name] Date: 03/27/2021 Reason for consult: Initial assessment;Mother's request;Term Age:34 hours  Mom just completed a feeding prior to [facility] arrival. Mom denied any pain with the latch.   LC reviewed feeding cues, different positions and how to get a deep latch.   Mom plan to EBM.   Plan 1. To feed based on cues 8-12x 24 hr period. Mom to offer breasts and look for signs of milk transfer.  2. If infant not able to latch, Mom to hand express and offer EBM via spoon.  3. I and O sheet reviewed.  4. [place] brochure of inpatient and outpatient services reviewed.  All questions answered at the end of the visit.   Maternal Data Has patient been taught Hand Expression?: Yes Does the patient have breastfeeding experience prior to this delivery?: Yes How long did the patient breastfeed?: 18 months  Feeding Mother's Current Feeding Choice: Breast Milk  LATCH Score Latch: Repeated attempts needed to sustain latch, nipple held in mouth throughout feeding, stimulation needed to elicit sucking reflex.  Audible Swallowing: None  Type of Nipple: Everted at rest and after stimulation  Comfort (Breast/Nipple): Soft / non-tender  Hold (Positioning): Assistance needed to correctly position infant at breast and maintain latch.  LATCH Score: 6   Lactation Tools Discussed/Used    Interventions Interventions: Breast feeding basics reviewed;Skin to skin;Breast massage;Position options;Hand express;Expressed milk;Education  Discharge Pump: Personal  Consult Status Consult Status: Follow-up Date: 03/28/21 Follow-up type: In-patient    Dionta Larke  Nicholson-Springer 03/27/2021, 4:43 PM

## 2021-03-28 LAB — CBC
HCT: 32.6 % — ABNORMAL LOW (ref 36.0–46.0)
Hemoglobin: 10.7 g/dL — ABNORMAL LOW (ref 12.0–15.0)
MCH: 28.6 pg (ref 26.0–34.0)
MCHC: 32.8 g/dL (ref 30.0–36.0)
MCV: 87.2 fL (ref 80.0–100.0)
Platelets: 169 10*3/uL (ref 150–400)
RBC: 3.74 MIL/uL — ABNORMAL LOW (ref 3.87–5.11)
RDW: 20.1 % — ABNORMAL HIGH (ref 11.5–15.5)
WBC: 14.1 10*3/uL — ABNORMAL HIGH (ref 4.0–10.5)
nRBC: 0 % (ref 0.0–0.2)

## 2021-03-28 NOTE — Lactation Note (Signed)
This note was copied from a baby's chart. Lactation Consultation Note  Patient Name: [name] [id number] Date: 03/28/2021 Reason for consult: Follow-up assessment;Term Age:34 hours   P2 mother whose infant is now 51 hours old.  This is a term baby at 39+3 weeks.  Mother breast fed her first child (now 2 years, 73 months old) for 2 years.  Baby was asleep on mother's chest when I arrived.  Mother reported that baby has been latching and feeding well.  Reviewed breast feeding basics and feeding plan for today.  Discussed cluster feeding.  Offered to return for latch observation/assistance as needed.  Provided coconut oil proactively for nipple care.  Mother appreciative.  Mother has a DEBP for home use.  Father present.   Maternal Data Has patient been taught Hand Expression?: Yes Does the patient have breastfeeding experience prior to this delivery?: Yes How long did the patient breastfeed?: 2 years  Feeding Mother's Current Feeding Choice: Breast Milk  LATCH Score                    Lactation Tools Discussed/Used Tools: Pump;Coconut oil Breast pump type: Manual  Interventions    Discharge Pump: Personal [place] Program: No  Consult Status Consult Status: Follow-up Date: 03/29/21 Follow-up type: In-patient    Aniyiah Zell R Sonjia Wilcoxson 03/28/2021, 10:34 AM

## 2021-03-28 NOTE — Social Work (Signed)
MOB was referred for history of anxiety.  * Referral screened out by Clinical Social Worker because none of the following criteria appear to apply: ~ History of anxiety/depression during this pregnancy, or of post-partum depression following prior delivery. ~ Diagnosis of anxiety and/or depression within last 3 years OR * MOB's symptoms currently being treated with medication and/or therapy. Per chart review MOB sees a therapist.   Please contact the Clinical Social Worker if needs arise, by [facility] request, or if MOB scores greater than 9/yes to question 10 on Edinburgh Postpartum Depression Screen.   Kathrin Greathouse, MSW, LCSW [facility] [place] [facility] Worker  [phone] 03/28/2021  8:27 AM

## 2021-03-28 NOTE — Progress Notes (Signed)
Subjective: Postpartum Day 1: Cesarean Delivery Patient reports pain controlled, no nausea/vomiting, tolerating PO. Ambulating and voiding without difficulty  Objective: Vital signs in last 24 hours: Temp:  [97 F (36.1 C)-98.7 F (37.1 C)] 97.7 F (36.5 C) (09/27 0742) Pulse Rate:  [57-88] 62 (09/27 0415) Resp:  [12-19] 18 (09/27 0415) BP: (86-116)/(54-71) 99/61 (09/27 0415) SpO2:  [98 %-100 %] 99 % (09/27 0742) Weight:  [94.3 kg] 94.3 kg (09/26 1058)  Physical Exam:  General: alert, cooperative, and appears stated age Lochia: appropriate Uterine Fundus: firm Incision: healing well DVT Evaluation: No evidence of DVT seen on physical exam.  Recent Labs    03/28/21 0514  HGB 10.7*  HCT 32.6*    Assessment/Plan: Status post Cesarean section. Doing well postoperatively.  Continue current care.  Vanessa Kick 03/28/2021, 10:43 AM

## 2021-03-29 LAB — SURGICAL PATHOLOGY

## 2021-03-29 MED ORDER — IBUPROFEN 800 MG PO TABS: 800.0000 mg | ORAL_TABLET | Freq: Three times a day (TID) | ORAL | 1 refills | Status: DC | PRN

## 2021-03-29 MED ORDER — OXYCODONE HCL 5 MG PO TABS: 5.0000 mg | ORAL_TABLET | Freq: Four times a day (QID) | ORAL | 0 refills | Status: DC | PRN

## 2021-03-29 NOTE — Progress Notes (Signed)
Subjective: Postpartum Day 2: Cesarean Delivery Patient reports pain controlled, no nausea/vomiting, tolerating PO. Ambulating and voiding without difficulty  Objective: Vital signs in last 24 hours: Temp:  [98.2 F (36.8 C)-98.4 F (36.9 C)] 98.4 F (36.9 C) (09/28 1470) Pulse Rate:  [60-64] 60 (09/28 0611) Resp:  [18] 18 (09/28 0611) BP: (103-107)/(61-64) 103/61 (09/28 0611) SpO2:  [98 %] 98 % (09/27 2038)  Physical Exam:  General: alert, cooperative, and appears stated age Lochia: appropriate Uterine Fundus: firm Incision: healing well DVT Evaluation: No evidence of DVT seen on physical exam.  Recent Labs    03/28/21 0514  HGB 10.7*  HCT 32.6*     Assessment/Plan: Status post Cesarean section. Doing well postoperatively.  Continue current care. [name] - endoscopy in postpartum, on PO iron, neg H pylori, on pantoprozole S/p BTL  [name] 03/29/2021, 7:59 AM

## 2021-03-29 NOTE — Lactation Note (Signed)
This note was copied from a baby's chart. Lactation Consultation Note  Patient Name: [name] [id number] Date: 03/29/2021 Reason for consult: Follow-up assessment Age:34 hours   P2 mother whose infant is now 45 hours old.  This is a term baby at 39+3 weeks.  Mother breast fed her first child (now 2 years, 55 months old) for 2 years.  Baby was asleep in mother's arms when I arrived.  Mother reported feedings are going well; no pain with feeding.  Nipples intact with no trauma.  Reviewed feeding plan for after discharge.  Discussed engorgement prevention/treatment.  Mother has our OP phone number for any concerns after discharge.  Informed her of our support groups for assistance as desired.  Father present and supportive.  Family looking forward to discharge today.   Maternal Data    Feeding    LATCH Score                    Lactation Tools Discussed/Used    Interventions    Discharge    Consult Status Consult Status: Complete Date: 03/29/21 Follow-up type: Call as needed    [name] [name] 03/29/2021, 10:03 AM

## 2021-03-29 NOTE — Discharge Summary (Signed)
Postpartum Discharge Summary  Date of Service updated     Patient Name: [name] DOB: 07-21-1985 MRN: [id number]  Date of admission: 03/27/2021 Delivery date:03/27/2021  Delivering provider: Jerelyn Charles  Date of discharge: 03/29/2021  Admitting diagnosis: History of cesarean delivery [Z98.891] Intrauterine pregnancy: [redacted]w[redacted]d     Secondary diagnosis:  Active Problems:   History of cesarean delivery  Additional problems: none    Discharge diagnosis: Term Pregnancy Delivered                                              Post partum procedures: none Augmentation: N/A Complications: None  Hospital course: Sceduled C/S   35 y.o. yo G2P2002 at [redacted]w[redacted]d was admitted to the hospital 03/27/2021 for scheduled cesarean section with the following indication:Elective Repeat.Delivery details are as follows:  Membrane Rupture Time/Date: 1:09 PM ,03/27/2021   Delivery Method:C-Section, Low Transverse  Details of operation can be found in separate operative note.  Patient had an uncomplicated postpartum course.  She is ambulating, tolerating a regular diet, passing flatus, and urinating well. Patient is discharged home in stable condition on  03/29/21        Newborn Data: Birth date:03/27/2021  Birth time:1:11 PM  Gender:Female  Living status:Living  Apgars:8 ,9  Weight:3600 g     Physical exam  Vitals:   03/28/21 0415 03/28/21 0742 03/28/21 2038 03/29/21 0611  BP: 99/61  107/64 103/61  Pulse: 62  64 60  Resp: 18  18 18   Temp: 98.7 F (37.1 C) 97.7 F (36.5 C) 98.2 F (36.8 C) 98.4 F (36.9 C)  TempSrc: Oral Oral Oral Oral  SpO2: 98% 99% 98%   Weight:      Height:       General: alert, cooperative, and no distress Lochia: appropriate Uterine Fundus: firm Incision: Healing well with no significant drainage, No significant erythema, Dressing is clean, dry, and intact DVT Evaluation: No evidence of DVT seen on physical exam. Negative Homan's sign. No cords or calf  tenderness. Labs: Lab Results  Component Value Date   WBC 14.1 (H) 03/28/2021   HGB 10.7 (L) 03/28/2021   HCT 32.6 (L) 03/28/2021   MCV 87.2 03/28/2021   PLT 169 03/28/2021   CMP Latest Ref Rng & Units 10/24/2020  Glucose 70 - 99 mg/dL 77  BUN 6 - 20 mg/dL 8  Creatinine 0.44 - 1.00 mg/dL 0.55  Sodium 135 - 145 mmol/L 135  Potassium 3.5 - 5.1 mmol/L 3.8  Chloride 98 - 111 mmol/L 103  CO2 22 - 32 mmol/L 20(L)  Calcium 8.9 - 10.3 mg/dL 8.3(L)  Total Protein 6.5 - 8.1 g/dL 6.3(L)  Total Bilirubin 0.3 - 1.2 mg/dL 0.4  Alkaline Phos 38 - 126 U/L 46  AST 15 - 41 U/L 18  ALT 0 - 44 U/L 21   Edinburgh Score: Edinburgh Postnatal Depression Scale Screening Tool 03/28/2021  I have been able to laugh and see the funny side of things. 0  I have looked forward with enjoyment to things. 0  I have blamed myself unnecessarily when things went wrong. 1  I have been anxious or worried for no good reason. 0  I have felt scared or panicky for no good reason. 0  Things have been getting on top of me. 1  I have been so unhappy that I have had difficulty  sleeping. 1  I have felt sad or miserable. 1  I have been so unhappy that I have been crying. 0  The thought of harming myself has occurred to me. 0  Edinburgh Postnatal Depression Scale Total 4      After visit meds:  Allergies as of 03/29/2021   No Known Allergies      Medication List     STOP taking these medications    doxylamine (Sleep) 25 MG tablet Commonly known as: UNISOM       TAKE these medications    ibuprofen 800 MG tablet Commonly known as: ADVIL Take 1 tablet (800 mg total) by mouth every 8 (eight) hours as needed.   Iron 325 (65 Fe) MG Tabs Take 325 mg by mouth every other day.   multivitamin-prenatal 27-0.8 MG Tabs tablet Take 1 tablet by mouth at bedtime.   oxyCODONE 5 MG immediate release tablet Commonly known as: Oxy IR/ROXICODONE Take 1 tablet (5 mg total) by mouth every 6 (six) hours as needed for  severe pain.   pantoprazole 40 MG tablet Commonly known as: Protonix Take 1 tablet (40 mg total) by mouth 2 (two) times daily. What changed: when to take this   sucralfate 1 GM/10ML suspension Commonly known as: Carafate Take 10 mLs (1 g total) by mouth 4 (four) times daily.   Vitamin C 500 MG Chew Chew 500 mg by mouth daily.         Discharge home in stable condition Infant Feeding: Breast Infant Disposition:home with mother Discharge instruction: per After Visit Summary and Postpartum booklet. Activity: Advance as tolerated. Pelvic rest for 6 weeks.  Diet: routine diet Anticipated Birth Control: BTL done PP Postpartum Appointment:6 weeks    03/29/2021 Deliah Boston, MD

## 2021-04-06 ENCOUNTER — Telehealth (HOSPITAL_COMMUNITY): Payer: Self-pay | Admitting: *Deleted

## 2021-04-06 NOTE — Telephone Encounter (Signed)
Mom reports feeling good. Back is still sore from 'traumatic placement' of spinal for surgery. Incision is healing well per mom. EPDS declined as mom states she feels the same emotionally as when she was in the hospital. EPDS was 4 in hospital. Mom has no concerns about herself. Mom reports baby is doing well. Feeding, peeing, and pooping without difficulty. Sleeps on back in bassinet. Reviewed safe sleep. Mom has no concerns about baby.  Odis Hollingshead, RN 04-06-2021 at 11:01am

## 2022-04-27 LAB — HM PAP SMEAR

## 2023-09-06 ENCOUNTER — Ambulatory Visit: Payer: 59 | Admitting: Family

## 2023-09-09 ENCOUNTER — Ambulatory Visit: Payer: 59 | Admitting: Family

## 2023-09-09 ENCOUNTER — Encounter: Payer: Self-pay | Admitting: Family

## 2023-09-09 VITALS — BP 114/68 | HR 78 | Temp 97.8°F | Ht 67.0 in | Wt 179.8 lb

## 2023-09-09 DIAGNOSIS — M25551 Pain in right hip: Secondary | ICD-10-CM

## 2023-09-09 DIAGNOSIS — R1031 Right lower quadrant pain: Secondary | ICD-10-CM | POA: Diagnosis not present

## 2023-09-09 NOTE — Progress Notes (Signed)
 New Patient Office Visit  Subjective:  Patient ID: [name], female    DOB: [birth year]  Age: 38 y.o. MRN: [id number]  CC:  Chief Complaint  Patient presents with   New Patient (Initial Visit)   Hip Pain    Pt c/o right hip pain, pt denies any injury. Present for 1 year, burning sensation in certain positions. Has tried light stretching.     HPI [name] presents for establishing care today.   History of Present Illness   The patient is a 38 year old woman who presents to establish care and with a chief complaint of hip pain that has been ongoing for approximately one year. She does not recall any specific incident that may have caused the pain, but she does remember a time when she was trying to remove a tight boot and may have pulled too hard. The pain is positional and is located in the right lateral hip which describes as soreness especially with palpation. She also describes burning, soreness in her right groin at times. She reports that the pain feels like a burning, pulling sensation and can sometimes cause her to limp for a few steps after getting out of a car. She also reports that she cannot sit with her leg in a figure four position without experiencing discomfort. She states her chiropractor did xray her right hip and no anomaly found.  In addition to the hip pain, the patient has been dealing with on & off  sciatic issues on the same side for the past two years. However, she has not experienced any sciatic pain for the past two weeks. She has been managing these issues with chiropractic care and stretching.  The patient also has a history of lower back pain following a difficult birth experience two years ago where she had a failed spinal block & had to have several injections to get anesthesia. She describes the pain as a sensation similar to bending a straw and has experienced phantom/shooting sensations in the area. The patient reports that the pain has been  improving over time with activity and stretching.     Assessment & Plan:     Hip Pain - Pain localized to the hip region, possibly related to a previous injury. No imaging abnormalities noted on previous x-rays. Patient reports discomfort with certain positions and occasional limping after sitting for extended periods. -Refer to physical therapy for evaluation and treatment plan. -Advised on alternating heat and ice for pain management. -Recommend over-the-counter analgesic creams for topical pain relief prn. -Will continue to monitor.  Lower Back Pain/Sciatica - Residual discomfort and sensations in the lower back following a difficult birth experience in 2022. Pain has improved over time with activity and stretching. -Discuss with physical therapist during evaluation for hip pain. -Advised on maintaining regular exercise and stretching routine for back health. -Will continue to monitor.  General Health Maintenance - -Schedule routine physical and fasting labs as it has been a couple of years since last done. -Continue annual GYN visits and Pap smear as needed.      Subjective:    Outpatient Medications Prior to Visit  Medication Sig Dispense Refill   tretinoin (RETIN-A) 0.05 % cream Apply topically at bedtime.     Ascorbic Acid (VITAMIN C) 500 MG CHEW Chew 500 mg by mouth daily. (Patient not taking: Reported on 09/09/2023)     Ferrous Sulfate (IRON) 325 (65 Fe) MG TABS Take 325 mg by mouth every other day. (Patient not  taking: Reported on 09/09/2023)     ibuprofen (ADVIL) 800 MG tablet Take 1 tablet (800 mg total) by mouth every 8 (eight) hours as needed. (Patient not taking: Reported on 09/09/2023) 60 tablet 1   oxyCODONE (OXY IR/ROXICODONE) 5 MG immediate release tablet Take 1 tablet (5 mg total) by mouth every 6 (six) hours as needed for severe pain. (Patient not taking: Reported on 09/09/2023) 28 tablet 0   pantoprazole (PROTONIX) 40 MG tablet Take 1 tablet (40 mg total) by mouth 2  (two) times daily. (Patient taking differently: Take 40 mg by mouth at bedtime.) 60 tablet 1   Prenatal Vit-Fe Fumarate-FA (MULTIVITAMIN-PRENATAL) 27-0.8 MG TABS tablet Take 1 tablet by mouth at bedtime. (Patient not taking: Reported on 09/09/2023)     sucralfate (CARAFATE) 1 GM/10ML suspension Take 10 mLs (1 g total) by mouth 4 (four) times daily. (Patient not taking: Reported on 03/22/2021) 420 mL 1   No facility-administered medications prior to visit.   Past Medical History:  Diagnosis Date   Acute appendicitis 11/07/2018   Anemia    Anxiety    sees a therapist   Appendicitis 11/07/2018   GERD (gastroesophageal reflux disease)    History of cesarean delivery 03/27/2021   Peptic ulcer    Pregnancy 07/25/2018   Past Surgical History:  Procedure Laterality Date   APPENDECTOMY  2020   CESAREAN SECTION N/A 07/25/2018   Procedure: CESAREAN SECTION;  Surgeon: Philip Aspen, DO;  [facility]: [facility];  Service: Obstetrics;  Laterality: N/A;   CESAREAN SECTION WITH BILATERAL TUBAL LIGATION N/A 03/27/2021   Procedure: REPEAT CESAREAN SECTION WITH BILATERAL TUBAL LIGATION;  Surgeon: Marlow Baars, MD;  Location: [facility];  Service: Obstetrics;  Laterality: N/A;   LAPAROSCOPIC APPENDECTOMY N/A 11/07/2018   Procedure: APPENDECTOMY LAPAROSCOPIC;  Surgeon: Harriette Bouillon, MD;  Location: [facility];  Service: General;  Laterality: N/A;   TONSILLECTOMY     TUBAL LIGATION  2022   WISDOM TOOTH EXTRACTION      Objective:   Today's Vitals: BP 114/68 (BP Location: Left Arm, Patient Position: Sitting, Cuff Size: Normal)   Pulse 78   Temp 97.8 F (36.6 C) (Temporal)   Ht 5\' 7"  (1.702 m)   Wt 179 lb 12.8 oz (81.6 kg)   LMP 08/22/2023 (Exact Date)   SpO2 98%   Breastfeeding No   BMI 28.16 kg/m   Physical Exam Vitals and nursing note reviewed.  Constitutional:      Appearance: Normal appearance.  Cardiovascular:     Rate and Rhythm: Normal rate and regular rhythm.  Pulmonary:      Effort: Pulmonary effort is normal.     Breath sounds: Normal breath sounds.  Musculoskeletal:     Right hip: Bony tenderness present. Decreased range of motion.  Skin:    General: Skin is warm and dry.  Neurological:     Mental Status: She is alert.  Psychiatric:        Mood and Affect: Mood normal.        Behavior: Behavior normal.     Dulce Sellar, NP

## 2023-09-09 NOTE — Patient Instructions (Signed)
 Welcome to [facility], It was a pleasure meeting you today!    I have sent a referral to our physical therapy office.   Please schedule a physical with fasting labs at your convenience today.     PLEASE NOTE: If you had any LAB tests please let us know if you have not heard back within a few days. You may see your results on MyChart before we have a chance to review them but we will give you a call once they are reviewed by [place]. If we ordered any REFERRALS today, please let us know if you have not heard from their office within the next week.  Let us know through MyChart if you are needing REFILLS, or have your pharmacy send [place] the request. You can also use MyChart to communicate with me or any office staff.  Please try these tips to maintain a healthy lifestyle: It is important that you exercise regularly at least 30 minutes 5 times a week. Think about what you will eat, plan ahead. Choose whole foods, & think  "clean, green, fresh or frozen" over canned, processed or packaged foods which are more sugary, salty, and fatty. 70 to 75% of food eaten should be fresh vegetables and protein. 2-3  meals daily with healthy snacks between meals, but must be whole fruit, protein or vegetables. Aim to eat over a 10 hour period when you are active, for example, 7am to 5pm, and then STOP after your last meal of the day, drinking only water.  Shorter eating windows, 6-8 hours, are showing benefits in heart disease and blood sugar regulation. Drink water every day! Shoot for 64 ounces daily = 8 cups, no other drink is as healthy! Fruit juice is best enjoyed in a healthy way, by EATING the fruit.

## 2023-09-17 NOTE — Therapy (Unsigned)
 OUTPATIENT PHYSICAL THERAPY LOWER EXTREMITY EVALUATION   Patient Name: [name] MRN: [id number] DOB:January 09, 1986, 38 y.o., female Today's Date: 09/18/2023  END OF SESSION:  PT End of Session - 09/18/23 0849     Visit Number 1    Number of Visits 16    Date for PT Re-Evaluation 12/11/23    Authorization Type U[employer] VL [id number]    Authorization - Visit Number 1    Authorization - Number of Visits 60    PT Start Time 0850    PT Stop Time 0931    PT Time Calculation (min) 41 min    Activity Tolerance Patient tolerated treatment well    Behavior During Therapy [facility] for tasks assessed/performed             Past Medical History:  Diagnosis Date   Acute appendicitis 11/07/2018   Anemia    Anxiety    sees a therapist   Appendicitis 11/07/2018   GERD (gastroesophageal reflux disease)    History of cesarean delivery 03/27/2021   Peptic ulcer    Pregnancy 07/25/2018   Past Surgical History:  Procedure Laterality Date   APPENDECTOMY  2020   CESAREAN SECTION N/A 07/25/2018   Procedure: CESAREAN SECTION;  Surgeon: Philip Aspen, DO;  Location: [facility];  Service: Obstetrics;  Laterality: N/A;   CESAREAN SECTION WITH BILATERAL TUBAL LIGATION N/A 03/27/2021   Procedure: REPEAT CESAREAN SECTION WITH BILATERAL TUBAL LIGATION;  Surgeon: Marlow Baars, MD;  Location: [facility];  Service: Obstetrics;  Laterality: N/A;   LAPAROSCOPIC APPENDECTOMY N/A 11/07/2018   Procedure: APPENDECTOMY LAPAROSCOPIC;  Surgeon: Harriette Bouillon, MD;  Location: [facility];  Service: General;  Laterality: N/A;   TONSILLECTOMY     TUBAL LIGATION  2022   WISDOM TOOTH EXTRACTION     There are no active problems to display for this patient.   PCP: Dulce Sellar, NP  REFERRING PROVIDER: Dulce Sellar, NP  REFERRING DIAG: M25.551 (ICD-10-CM) - Pain of right hip R10.31 (ICD-10-CM) - Right groin pain  THERAPY DIAG:  Pain in right hip  Muscle weakness (generalized)  Other low back  pain  Rationale for Evaluation and Treatment: Rehabilitation  ONSET DATE: 2022  SUBJECTIVE:   SUBJECTIVE STATEMENT: Patient reports she has had ongoing pain for a couple of years.  Reports that it primarily started after the birth of her youngest daughter in 2022.  Reports she had a planned cesarean section which would have been her second however the epidural did not go well and they needed to get a second anesthesiologist in after multiple attempts at the epidural.  Reports that since then she has had hip and back pain.  Reports that her back sometimes feels like a ND straw and initially after her last childbirth she had this phantom pain in her right hip.  Reports she has gone to multiple doctors who do not seem to be too concerned with it.  Reports she also has seen a chiropractor fairly consistently every 3 to 4 weeks prior to this incident.   States she has not been able to sit crisscross on the floor with her children and getting up and off the floor is very difficult.  Reports she has been afraid to workout intensely because of this pain and wants to improve her overall mobility and function.  Reports she also has symptoms that at times radiate down the back of the leg all the way to the heel and this pain is typically more burning pain.  Alternatively she has pain in her groin and lateral hip which is more of a pinch she achy pain.  Reports she does not know if these are related at all but really wants to improve her overall function and mobility.  Reports she and her family are going to [facility] in May and it is her overall goal to have less pain and improved mobility by then.  She sees the chiropractor later today    PERTINENT HISTORY: Cesarean delivery x2, appendectomy, migraine PAIN:  Are you having pain? Yes: NPRS scale: 6/10 Pain location: right hip groin, right lateral hip and down back leg Pain description: hitch, catch, pinch Aggravating factors: sitting on floor, Relieving  factors: stretching, cryotherapy   PRECAUTIONS: None  RED FLAGS: None   WEIGHT BEARING RESTRICTIONS: No  FALLS:  Has patient fallen in last 6 months? 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  DIAGNOSTIC FINDINGS: xrays - not on file  PATIENT SURVEYS:  Patient-specific activity functional scoring scheme (Point to one number):  "0" represents "unable to perform." "10" represents "able to perform at prior level. 0 1 2 3 4 5 6 7 8 9  10 (Date and Score) Activity Initial  Activity Eval     Sitting criss cross applesauce 0     Intense Working out  0    bending 4    Additional Additional Total score = sum of the activity scores/number of activities Minimum detectable change (90%CI) for average score = 2 points Minimum detectable change (90%CI) for single activity score = 3 points PSFS developed by: Jake Seats., & Binkley, J. (1995). Assessing disability and change on individual  patients: a report of a patient specific measure. Physiotherapy [place], [phone], [phone]. Reproduced with the permission of the authors  Score: 4/3= 1.33   COGNITION: Overall cognitive status: Within functional limits for tasks assessed     SENSATION: Not tested     MUSCLE LENGTH: Hamstrings: Right 30* deg; Left 0 deg    POSTURE: decreased lumbar lordosis, increased thoracic kyphosis, and flexed trunk   PALPATION: increased resting tone in right glutes/left lumbar paraspinals, right anterior obliques/groin - pain with increased resting tone areas   Lumbar A/ROM:    EVAL     Flexion 75% limited *     Extension  90% limited*     R ROT  50% limited*     L ROT  50% limited*      R SB      L SB      * Pain   (Blank rows = not tested)   LE Measurements Lower Extremity Right EVAL Left EVAL   A/PROM MMT  A/PROM MMT  Hip Flexion WFL 3+ WFL 3+  Hip Extension [facility]   Hip Abduction      Hip Adduction      Hip Internal rotation [facility]  [facility]   Hip External rotation [facility]  WFL   Knee Flexion WLF 4 WFL 4  Knee Extension WFL 4+ wFL 4+  Ankle Dorsiflexion  5  5  Ankle Plantarflexion      Ankle Inversion      Ankle Eversion       (Blank rows = not tested) * pain   LOWER EXTREMITY SPECIAL TESTS:  slump test + right, ely's  neg B, SLR neg B  FUNCTIONAL TESTS:  Breathing Assessment: anterior breathing with increased belly distension with breath hold / forced exhale                                                                                                                                   TREATMENT DATE:   09/18/2023  Therapeutic Exercise:  Educated on anatomy and breathing mechanics 10 minutes  Supine: Prone: belly breathing 6 minutes - over pillow  Seated:  Standing: Neuromuscular Re-education: long exhale breathing 6 minutes verbal and tactile cues --> progress to thinking about zipper from sternum to pubic bone (3 minutes) Manual Therapy: Therapeutic Activity: Self Care: Trigger Point Dry Needling:  Modalities:    PATIENT EDUCATION:  Education details: on current presentation, on HEP, on clinical outcomes score and POC, on anatomy, rational behind interventions, breathing mechanics and pelvic floor Person educated: Patient Education method: Explanation, Demonstration, and Handouts Education comprehension: verbalized understanding   HOME EXERCISE PROGRAM: [id number] Long exhale breathing with zipper   ASSESSMENT:  CLINICAL IMPRESSION: Patient presents to physical therapy with complaints of chronic right hip and low back pain that started after delivery of youngest child in 2022.  Symptoms present with 3 areas of pain including her low back, right lateral hip, posterior leg symptoms down to her heel, and right anterior groin.  Patient presents with limitations in range of  motion, strength and overall postural limitations that are greatly contributing to current presentation.  Session focused on education as well as establishing initial home exercise program to address the findings found in examination.  Patient limited in ability to take care of of her children at home and would greatly benefit from skilled PT to address physical limitations and improve overall function and quality of life.  OBJECTIVE IMPAIRMENTS: decreased activity tolerance, difficulty walking, decreased ROM, decreased strength, improper body mechanics, postural dysfunction, and pain.   ACTIVITY LIMITATIONS: lifting, bending, sitting, and locomotion level  PARTICIPATION LIMITATIONS: cleaning and community activity  PERSONAL FACTORS: Fitness, Time since onset of injury/illness/exacerbation, and 1 comorbidity: x2 cesarean deliervies  are also affecting patient's functional outcome.   REHAB POTENTIAL: Good  CLINICAL DECISION MAKING: Stable/uncomplicated  EVALUATION COMPLEXITY: Low   GOALS: Goals reviewed with patient? yes  SHORT TERM GOALS: Target date: 10/30/2023  Patient will be independent in self management strategies to improve quality of life and functional outcomes. Baseline: New Program Goal status: INITIAL  2.  Patient will report at least 50% improvement in overall symptoms and/or function to demonstrate improved functional mobility Baseline: 0% better Goal status: INITIAL  3.  Patient will be able to demonstrate long exhale without belly distention to demonstrate improved activation of core musculature with breathing Baseline: Unable Goal status: INITIAL    LONG TERM GOALS: Target date: 12/11/2023   Patient will report at least 75% improvement in overall symptoms and/or function to demonstrate improved functional mobility Baseline: 0% better Goal  status: INITIAL  2.  Patient will score at least 2 points higher on PSFS average to demonstrate change in overall  function. Baseline: see above Goal status: INITIAL  3.  Patient will demonstrate pain-free lumbar range of motion in all directions Baseline: Painful Goal status: INITIAL  4.  Patient will be able to bend with good mechanics to reduce stress on pelvis and low back Baseline: Unable painful Goal status: INITIAL    PLAN:  PT FREQUENCY: 1-2x/week for total of 16 visits over 12 weeks certification.  PT DURATION: 12 weeks  PLANNED INTERVENTIONS: 97110-Therapeutic exercises, 97530- Therapeutic activity, [id number]- Neuromuscular re-education, [phone]- Self Care, 60454- Manual therapy, [phone]- Gait training, [phone]- Orthotic Fit/training, [phone]- Canalith repositioning, [id number]- Aquatic Therapy, 97014- Electrical stimulation (unattended), [phone]- Ionotophoresis 4mg /ml Dexamethasone, Patient/Family education, Balance training, Stair training, Taping, Dry Needling, Joint mobilization, Joint manipulation, Spinal manipulation, Spinal mobilization, Cryotherapy, and Moist heat   PLAN FOR NEXT SESSION: sit bone education, posture,  breathing, STM to right pelvis and left low back - try percussion, isometrics, breathing exercises, quadruped   11:37 AM, 09/18/23 Tereasa Coop, DPT Physical Therapy with [facility]

## 2023-09-18 ENCOUNTER — Encounter: Payer: Self-pay | Admitting: Physical Therapy

## 2023-09-18 ENCOUNTER — Ambulatory Visit: Admitting: Physical Therapy

## 2023-09-18 DIAGNOSIS — M5459 Other low back pain: Secondary | ICD-10-CM

## 2023-09-18 DIAGNOSIS — M25551 Pain in right hip: Secondary | ICD-10-CM | POA: Diagnosis not present

## 2023-09-18 DIAGNOSIS — M6281 Muscle weakness (generalized): Secondary | ICD-10-CM | POA: Diagnosis not present

## 2023-09-24 ENCOUNTER — Encounter: Admitting: Family

## 2023-09-27 ENCOUNTER — Encounter: Payer: Self-pay | Admitting: Family

## 2023-09-27 ENCOUNTER — Ambulatory Visit (INDEPENDENT_AMBULATORY_CARE_PROVIDER_SITE_OTHER): Admitting: Family

## 2023-09-27 VITALS — BP 129/78 | HR 72 | Temp 97.7°F | Ht 67.0 in | Wt 177.7 lb

## 2023-09-27 DIAGNOSIS — Z0000 Encounter for general adult medical examination without abnormal findings: Secondary | ICD-10-CM

## 2023-09-27 DIAGNOSIS — Z1159 Encounter for screening for other viral diseases: Secondary | ICD-10-CM | POA: Diagnosis not present

## 2023-09-27 LAB — COMPREHENSIVE METABOLIC PANEL WITH GFR
ALT: 12 U/L (ref 0–35)
AST: 14 U/L (ref 0–37)
Albumin: 4.4 g/dL (ref 3.5–5.2)
Alkaline Phosphatase: 46 U/L (ref 39–117)
BUN: 9 mg/dL (ref 6–23)
CO2: 26 meq/L (ref 19–32)
Calcium: 9.1 mg/dL (ref 8.4–10.5)
Chloride: 107 meq/L (ref 96–112)
Creatinine, Ser: 0.66 mg/dL (ref 0.40–1.20)
GFR: 112.89 mL/min (ref >60.00)
Glucose, Bld: 85 mg/dL (ref 70–99)
Potassium: 4.2 meq/L (ref 3.5–5.1)
Sodium: 140 meq/L (ref 135–145)
Total Bilirubin: 0.4 mg/dL (ref 0.2–1.2)
Total Protein: 7.4 g/dL (ref 6.0–8.3)

## 2023-09-27 LAB — CBC WITH DIFFERENTIAL/PLATELET
Basophils Absolute: 0.1 10*3/uL (ref 0.0–0.1)
Basophils Relative: 1 % (ref 0.0–3.0)
Eosinophils Absolute: 0.1 10*3/uL (ref 0.0–0.7)
Eosinophils Relative: 1.2 % (ref 0.0–5.0)
HCT: 39.2 % (ref 36.0–46.0)
Hemoglobin: 12.9 g/dL (ref 12.0–15.0)
Lymphocytes Relative: 36.9 % (ref 12.0–46.0)
Lymphs Abs: 1.9 10*3/uL (ref 0.7–4.0)
MCHC: 32.9 g/dL (ref 30.0–36.0)
MCV: 86.4 fl (ref 78.0–100.0)
Monocytes Absolute: 0.3 10*3/uL (ref 0.1–1.0)
Monocytes Relative: 5.3 % (ref 3.0–12.0)
Neutro Abs: 2.9 10*3/uL (ref 1.4–7.7)
Neutrophils Relative %: 55.6 % (ref 43.0–77.0)
Platelets: 213 10*3/uL (ref 150.0–400.0)
RBC: 4.54 Mil/uL (ref 3.87–5.11)
RDW: 15.1 % (ref 11.5–15.5)
WBC: 5.1 10*3/uL (ref 4.0–10.5)

## 2023-09-27 LAB — LIPID PANEL
Cholesterol: 155 mg/dL (ref 0–200)
HDL: 67.4 mg/dL (ref >39.00)
LDL Cholesterol: 71 mg/dL (ref 0–99)
NonHDL: 87.27
Total CHOL/HDL Ratio: 2
Triglycerides: 81 mg/dL (ref 0.0–149.0)
VLDL: 16.2 mg/dL (ref 0.0–40.0)

## 2023-09-27 LAB — TSH: TSH: 1.67 u[IU]/mL (ref 0.35–5.50)

## 2023-09-27 NOTE — Patient Instructions (Signed)
 It was very nice to see you today!   I will review your lab results via MyChart in a few days.   Stay well, get outside and enjoy the Spring Season!      PLEASE NOTE:  If you had any lab tests please let us know if you have not heard back within a few days. You may see your results on MyChart before we have a chance to review them but we will give you a call once they are reviewed by [place]. If we ordered any referrals today, please let us know if you have not heard from their office within the next week.

## 2023-09-27 NOTE — Progress Notes (Signed)
 Phone [phone]  Subjective:   Patient is a 38 y.o. female presenting for annual physical.    Chief Complaint  Patient presents with   Annual Exam    Fasting w/ labs   Discussed the use of AI scribe software for clinical note transcription with the patient, who gave verbal consent to proceed.  History of Present Illness She has noticed these changes over the past few months. She denies any pain or discomfort during urination, and there is no history of urinary tract infections. The patient also reports a hip issue that has limited her ability to exercise. She is currently seeking physical therapy for this issue.  The patient has dietary restrictions due to allergies to eggs and dairy. After eliminating these from her diet, she experienced significant weight loss of sixty pounds over three months. She has found suitable substitutes for these foods and maintains a healthy diet.  The patient also mentions a recent dental issue involving a root canal and a tooth extraction. She has a good relationship with her dentist and maintains regular dental check-ups.  See problem oriented charting- ROS- full  review of systems was completed and negative except for what is noted in HPI above.  The following were reviewed and entered/updated in epic: Past Medical History:  Diagnosis Date   Acute appendicitis 11/07/2018   Anemia    Anxiety    sees a therapist   Appendicitis 11/07/2018   GERD (gastroesophageal reflux disease)    History of cesarean delivery 03/27/2021   Peptic ulcer    Pregnancy 07/25/2018   There are no active problems to display for this patient.  Past Surgical History:  Procedure Laterality Date   APPENDECTOMY  2020   CESAREAN SECTION N/A 07/25/2018   Procedure: CESAREAN SECTION;  Surgeon: Philip Aspen, DO;  Location: [facility] BIRTHING SUITES;  Service: Obstetrics;  Laterality: N/A;   CESAREAN SECTION WITH BILATERAL TUBAL LIGATION N/A 03/27/2021   Procedure: REPEAT  CESAREAN SECTION WITH BILATERAL TUBAL LIGATION;  Surgeon: Marlow Baars, MD;  Location: [facility] LD ORS;  Service: Obstetrics;  Laterality: N/A;   LAPAROSCOPIC APPENDECTOMY N/A 11/07/2018   Procedure: APPENDECTOMY LAPAROSCOPIC;  Surgeon: Harriette Bouillon, MD;  Location: [facility] OR;  Service: General;  Laterality: N/A;   TONSILLECTOMY     TUBAL LIGATION  2022   WISDOM TOOTH EXTRACTION      Family History  Problem Relation Age of Onset   Healthy Mother    Healthy Father    Arthritis Father    Alcohol abuse Maternal Grandfather    Diabetes Maternal Grandmother    Stroke Maternal Grandmother    Cancer Paternal Grandfather    Cancer Paternal Grandmother     Medications- reviewed and updated Current Outpatient Medications  Medication Sig Dispense Refill   tretinoin (RETIN-A) 0.05 % cream Apply topically at bedtime.     No current facility-administered medications for this visit.    Allergies-reviewed and updated No Known Allergies  Social History   Social History Narrative   Not on file    Objective:  BP 129/78 (BP Location: Left Arm, Patient Position: Sitting, Cuff Size: Large)   Pulse 72   Temp 97.7 F (36.5 C) (Temporal)   Ht 5\' 7"  (1.702 m)   Wt 177 lb 11.2 oz (80.6 kg)   LMP 08/22/2023 (Exact Date)   SpO2 100%   BMI 27.83 kg/m  Physical Exam Vitals and nursing note reviewed.  Constitutional:      Appearance: Normal appearance.  HENT:  Head: Normocephalic.     Right Ear: Tympanic membrane normal.     Left Ear: Tympanic membrane normal.     Nose: Nose normal.     Mouth/Throat:     Mouth: Mucous membranes are moist.  Eyes:     Pupils: Pupils are equal, round, and reactive to light.  Cardiovascular:     Rate and Rhythm: Normal rate and regular rhythm.  Pulmonary:     Effort: Pulmonary effort is normal.     Breath sounds: Normal breath sounds.  Musculoskeletal:        General: Normal range of motion.     Cervical back: Normal range of motion.  Lymphadenopathy:      Cervical: No cervical adenopathy.  Skin:    General: Skin is warm and dry.  Neurological:     Mental Status: She is alert.  Psychiatric:        Mood and Affect: Mood normal.        Behavior: Behavior normal.     Assessment and Plan   Health Maintenance counseling: 1. Anticipatory guidance: Patient counseled regarding regular dental exams q6 months, eye exams,  avoiding smoking and second hand smoke, limiting alcohol to 1 beverage per day, no illicit drugs.   2. Risk factor reduction:  Advised patient of need for regular exercise and diet rich with fruits and vegetables to reduce risk of heart attack and stroke. Wt Readings from Last 3 Encounters:  09/27/23 177 lb 11.2 oz (80.6 kg)  09/09/23 179 lb 12.8 oz (81.6 kg)  03/27/21 208 lb (94.3 kg)   3. Immunizations/screenings/ancillary studies There is no immunization history for the selected administration types on file for this patient. There are no preventive care reminders to display for this patient.   4. Cervical cancer screening: utd 2023 5. Skin cancer screening- advised regular sunscreen use. Denies worrisome, changing, or new skin lesions. Pt established with DERM. 6. Birth control/STD check: tubal ligation 7. Smoking associated screening: non- smoker 8. Alcohol screening: 1 per week  Assessment & Plan Hip Pain - Ongoing hip pain limits exercise. Pt working with PT on stretches and exercises.  Insoles recommended to prevent further issues. - Continue physical therapy with Marcelino Duster. - Incorporate pelvic floor exercises. - Use insoles based on arch type, recommend full fitting via shoe store like [employer] and finding proper insoles.  Urinary Hesitancy - Slower urinary stream, possibly related to pelvic floor dysfunction. No infection present. -Can work on pelvic floor exercises w/PT. - Consider referral to urogynecologist if symptoms persist. - Ensure adequate hydration.  Egg and Dairy Allergy - Postpartum allergy  to eggs and dairy confirmed. Symptoms managed through dietary elimination. - Continue avoidance of eggs and dairy. - Utilize substitutes as needed.  General Health Maintenance - Non-smoker, infrequent alcohol consumption, post-tubal ligation, aims for adequate hydration and regular dental care. -Order complete CPE panel. - Encourage continued hydration, healthy diet and exercise as able. - Maintain regular dental check-ups.   Recommended follow up:  No follow-ups on file. Future Appointments  Date Time Provider Department Center  09/30/2023 12:15 PM Hermenia Bers, PT [facility] None  10/02/2023  9:30 AM Hermenia Bers, PT [facility] None  10/08/2023  9:30 AM Hermenia Bers, PT [facility] None    Lab/Order associations: fasting    Dulce Sellar, NP

## 2023-09-28 LAB — HEPATITIS C ANTIBODY: Hepatitis C Ab: NONREACTIVE

## 2023-09-29 ENCOUNTER — Encounter: Payer: Self-pay | Admitting: Family

## 2023-09-30 ENCOUNTER — Encounter: Admitting: Physical Therapy

## 2023-10-02 ENCOUNTER — Ambulatory Visit: Admitting: Physical Therapy

## 2023-10-02 ENCOUNTER — Encounter: Payer: Self-pay | Admitting: Physical Therapy

## 2023-10-02 DIAGNOSIS — M5459 Other low back pain: Secondary | ICD-10-CM

## 2023-10-02 DIAGNOSIS — M6281 Muscle weakness (generalized): Secondary | ICD-10-CM

## 2023-10-02 DIAGNOSIS — M25551 Pain in right hip: Secondary | ICD-10-CM

## 2023-10-02 NOTE — Therapy (Signed)
 OUTPATIENT PHYSICAL THERAPY LOWER EXTREMITY TREATMENT   Patient Name: [name] MRN: [id number] DOB:February 05, 1986, 38 y.o., female Today's Date: 10/02/2023  END OF SESSION:  PT End of Session - 10/02/23 0928     Visit Number 2    Number of Visits 16    Date for PT Re-Evaluation 12/11/23    Authorization Type UHC VL [id number]    Authorization - Visit Number 2    Authorization - Number of Visits 60    PT Start Time 0932    PT Stop Time 1015    PT Time Calculation (min) 43 min    Activity Tolerance Patient tolerated treatment well    Behavior During Therapy [facility] for tasks assessed/performed             Past Medical History:  Diagnosis Date   Acute appendicitis 11/07/2018   Anemia    Anxiety    sees a therapist   Appendicitis 11/07/2018   GERD (gastroesophageal reflux disease)    History of cesarean delivery 03/27/2021   Peptic ulcer    Pregnancy 07/25/2018   Past Surgical History:  Procedure Laterality Date   APPENDECTOMY  2020   CESAREAN SECTION N/A 07/25/2018   Procedure: CESAREAN SECTION;  Surgeon: Philip Aspen, DO;  Location: [facility] BIRTHING SUITES;  Service: Obstetrics;  Laterality: N/A;   CESAREAN SECTION WITH BILATERAL TUBAL LIGATION N/A 03/27/2021   Procedure: REPEAT CESAREAN SECTION WITH BILATERAL TUBAL LIGATION;  Surgeon: Marlow Baars, MD;  Location: [facility] LD ORS;  Service: Obstetrics;  Laterality: N/A;   LAPAROSCOPIC APPENDECTOMY N/A 11/07/2018   Procedure: APPENDECTOMY LAPAROSCOPIC;  Surgeon: Harriette Bouillon, MD;  Location: [facility] OR;  Service: General;  Laterality: N/A;   TONSILLECTOMY     TUBAL LIGATION  2022   WISDOM TOOTH EXTRACTION     There are no active problems to display for this patient.   PCP: Dulce Sellar, NP  REFERRING PROVIDER: Dulce Sellar, NP  REFERRING DIAG: M25.551 (ICD-10-CM) - Pain of right hip R10.31 (ICD-10-CM) - Right groin pain  THERAPY DIAG:  Pain in right hip  Muscle weakness (generalized)  Other low back  pain  Rationale for Evaluation and Treatment: Rehabilitation  ONSET DATE: 2022  SUBJECTIVE:   SUBJECTIVE STATEMENT: 10/02/2023 Felt abs engaged with breathing exercises. States she has been doing her light stretching and that helps with the sciatic at bay. States she has had stress fractures in her  feet and curious about different types of shoes to wear. Going to [employer] but needs a pair of walking shoe. Wants to start incorporating exercise.   Has some lateral hip tenderness on the right side.    EVAL: Patient reports she has had ongoing pain for a couple of years.  Reports that it primarily started after the birth of her youngest daughter in 2022.  Reports she had a planned cesarean section which would have been her second however the epidural did not go well and they needed to get a second anesthesiologist in after multiple attempts at the epidural.  Reports that since then she has had hip and back pain.  Reports that her back sometimes feels like a ND straw and initially after her last childbirth she had this phantom pain in her right hip.  Reports she has gone to multiple doctors who do not seem to be too concerned with it.  Reports she also has seen a chiropractor fairly consistently every 3 to 4 weeks prior to this incident.   States she has not been able  to sit crisscross on the floor with her children and getting up and off the floor is very difficult.  Reports she has been afraid to workout intensely because of this pain and wants to improve her overall mobility and function.  Reports she also has symptoms that at times radiate down the back of the leg all the way to the heel and this pain is typically more burning pain.  Alternatively she has pain in her groin and lateral hip which is more of a pinch she achy pain.  Reports she does not know if these are related at all but really wants to improve her overall function and mobility.  Reports she and her family are going to [facility] in May and  it is her overall goal to have less pain and improved mobility by then.  She sees the chiropractor later today    PERTINENT HISTORY: Cesarean delivery x2, appendectomy, migraine PAIN:  Are you having pain? Yes: NPRS scale: 3/10 Pain location: right hip groin, right lateral hip and down back leg Pain description: hitch, catch, pinch Aggravating factors: sitting on floor, Relieving factors: stretching, cryotherapy   PRECAUTIONS: None  RED FLAGS: None   WEIGHT BEARING RESTRICTIONS: No  FALLS:  Has patient fallen in last 6 months? 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  DIAGNOSTIC FINDINGS: xrays - not on file  PATIENT SURVEYS:  Patient-specific activity functional scoring scheme (Point to one number):  "0" represents "unable to perform." "10" represents "able to perform at prior level. 0 1 2 3 4 5 6 7 8 9  10 (Date and Score) Activity Initial  Activity Eval     Sitting criss cross applesauce 0     Intense Working out  0    bending 4    Additional Additional Total score = sum of the activity scores/number of activities Minimum detectable change (90%CI) for average score = 2 points Minimum detectable change (90%CI) for single activity score = 3 points PSFS developed by: Jake Seats., & Binkley, J. (1995). Assessing disability and change on individual  patients: a report of a patient specific measure. Physiotherapy [place], [phone], [phone]. Reproduced with the permission of the authors  Score: 4/3= 1.33   COGNITION: Overall cognitive status: Within functional limits for tasks assessed     SENSATION: Not tested     MUSCLE LENGTH: Hamstrings: Right 30* deg; Left 0 deg    POSTURE: decreased lumbar lordosis, increased thoracic kyphosis, and flexed trunk   PALPATION: increased resting  tone in right glutes/left lumbar paraspinals, right anterior obliques/groin - pain with increased resting tone areas   Lumbar A/ROM:    EVAL     Flexion 75% limited *     Extension  90% limited*     R ROT  50% limited*     L ROT  50% limited*      R SB      L SB      * Pain   (Blank rows = not tested)   LE Measurements Lower Extremity Right EVAL Left EVAL   A/PROM MMT A/PROM MMT  Hip Flexion WFL 3+ WFL 3+  Hip Extension [facility]  [facility]   Hip Abduction      Hip Adduction      Hip Internal rotation [facility]  [facility]  Hip External rotation [facility]  WFL   Knee Flexion WLF 4 WFL 4  Knee Extension WFL 4+ wFL 4+  Ankle Dorsiflexion  5  5  Ankle Plantarflexion      Ankle Inversion      Ankle Eversion       (Blank rows = not tested) * pain   LOWER EXTREMITY SPECIAL TESTS:  slump test + right, ely's neg B, SLR neg B  FUNCTIONAL TESTS:  Breathing Assessment: anterior breathing with increased belly distension with breath hold / forced exhale                                                                                                                                   TREATMENT DATE:   10/02/2023  Therapeutic Exercise:  Educated on different types of shoes/benefits of shoe shade/support and inserts, on different types of exercises and walking to do for health Supine: Quad: scap protraction/retraction - x15 5" hlds, holds in plank x5 10" holds    Seated:  Standing: Neuromuscular Re-education: bridge with post pelvic tilt and with cues to breath for improved core engagement and posture- 10 minutes total  Manual Therapy: Therapeutic Activity: Self Care: Trigger Point Dry Needling:  Modalities:    PATIENT EDUCATION:  Education details: on HEP Person educated: Patient Education method: Programmer, multimedia, Demonstration, and Handouts Education comprehension: verbalized understanding   HOME EXERCISE PROGRAM: [id number] Long exhale breathing with zipper   ASSESSMENT:  CLINICAL  IMPRESSION: 10/02/2023 Focused on education and answering all questions about different types of exercises and shoes. Discussed benefits of shoe shape/support and inserts and what to look for for different activities. Initially bridge painful in back but this resolved with posterior pelvic tilt and breathing as patient had tendency to hold breath. Added new exercises to HEP, will continue with current POC as tolerated   EVAL: Patient presents to physical therapy with complaints of chronic right hip and low back pain that started after delivery of youngest child in 2022.  Symptoms present with 3 areas of pain including her low back, right lateral hip, posterior leg symptoms down to her heel, and right anterior groin.  Patient presents with limitations in range of motion, strength and overall postural limitations that are greatly contributing to current presentation.  Session focused on education as well as establishing initial home exercise program to address the findings found in examination.  Patient limited in ability to take care of of her children at home and would greatly benefit from skilled PT to address physical limitations and improve overall function and quality of life.  OBJECTIVE IMPAIRMENTS: decreased activity tolerance, difficulty walking, decreased ROM, decreased strength, improper body mechanics, postural dysfunction, and pain.   ACTIVITY LIMITATIONS: lifting, bending, sitting, and locomotion level  PARTICIPATION LIMITATIONS: cleaning and community activity  PERSONAL FACTORS: Fitness, Time since onset of injury/illness/exacerbation, and 1 comorbidity: x2 cesarean deliervies  are also affecting patient's functional outcome.   REHAB  POTENTIAL: Good  CLINICAL DECISION MAKING: Stable/uncomplicated  EVALUATION COMPLEXITY: Low   GOALS: Goals reviewed with patient? yes  SHORT TERM GOALS: Target date: 10/30/2023  Patient will be independent in self management strategies to improve  quality of life and functional outcomes. Baseline: New Program Goal status: INITIAL  2.  Patient will report at least 50% improvement in overall symptoms and/or function to demonstrate improved functional mobility Baseline: 0% better Goal status: INITIAL  3.  Patient will be able to demonstrate long exhale without belly distention to demonstrate improved activation of core musculature with breathing Baseline: Unable Goal status: INITIAL    LONG TERM GOALS: Target date: 12/11/2023   Patient will report at least 75% improvement in overall symptoms and/or function to demonstrate improved functional mobility Baseline: 0% better Goal status: INITIAL  2.  Patient will score at least 2 points higher on PSFS average to demonstrate change in overall function. Baseline: see above Goal status: INITIAL  3.  Patient will demonstrate pain-free lumbar range of motion in all directions Baseline: Painful Goal status: INITIAL  4.  Patient will be able to bend with good mechanics to reduce stress on pelvis and low back Baseline: Unable painful Goal status: INITIAL    PLAN:  PT FREQUENCY: 1-2x/week for total of 16 visits over 12 weeks certification.  PT DURATION: 12 weeks  PLANNED INTERVENTIONS: 97110-Therapeutic exercises, 97530- Therapeutic activity, [id number]- Neuromuscular re-education, [phone]- Self Care, 69629- Manual therapy, [phone]- Gait training, [phone]- Orthotic Fit/training, [phone]- Canalith repositioning, [id number]- Aquatic Therapy, 97014- Electrical stimulation (unattended), [phone]- Ionotophoresis 4mg /ml Dexamethasone, Patient/Family education, Balance training, Stair training, Taping, Dry Needling, Joint mobilization, Joint manipulation, Spinal manipulation, Spinal mobilization, Cryotherapy, and Moist heat   PLAN FOR NEXT SESSION: sit bone education, posture,  breathing, STM to right pelvis and left low back - try percussion, isometrics, breathing exercises, quadruped   10:41 AM,  10/02/23 Tereasa Coop, DPT Physical Therapy with [facility]

## 2023-10-08 ENCOUNTER — Ambulatory Visit: Admitting: Physical Therapy

## 2023-10-08 ENCOUNTER — Encounter: Admitting: Physical Therapy

## 2023-10-08 ENCOUNTER — Encounter: Payer: Self-pay | Admitting: Physical Therapy

## 2023-10-08 DIAGNOSIS — M6281 Muscle weakness (generalized): Secondary | ICD-10-CM

## 2023-10-08 DIAGNOSIS — M5459 Other low back pain: Secondary | ICD-10-CM | POA: Diagnosis not present

## 2023-10-08 DIAGNOSIS — M25551 Pain in right hip: Secondary | ICD-10-CM | POA: Diagnosis not present

## 2023-10-08 NOTE — Therapy (Addendum)
 PHYSICAL THERAPY DISCHARGE SUMMARY  Visits from Start of Care: 3  Current functional level related to goals / functional outcomes: Could not assess - unplanned discharge   Remaining deficits: Could not assess - unplanned discharge   Education / Equipment: Could not assess - unplanned discharge   Patient agrees to discharge. Patient goals were not met. Patient is being discharged due to not returning since the last visit.  9:02 AM, 12/02/23 [name], DPT Physical Therapy with [place]   OUTPATIENT PHYSICAL THERAPY LOWER EXTREMITY TREATMENT   Patient Name: [name] MRN: [id number] DOB:1985/10/20, 38 y.o., female Today's Date: 10/08/2023  END OF SESSION:  PT End of Session - 10/08/23 1208     Visit Number 3    Number of Visits 16    Date for PT Re-Evaluation 12/11/23    Authorization Type UHC VL [id number]    Authorization - Visit Number 3    Authorization - Number of Visits 60    PT Start Time 1215    PT Stop Time 1255    PT Time Calculation (min) 40 min    Activity Tolerance Patient tolerated treatment well    Behavior During Therapy [facility] for tasks assessed/performed             Past Medical History:  Diagnosis Date   Acute appendicitis 11/07/2018   Anemia    Anxiety    sees a therapist   Appendicitis 11/07/2018   GERD (gastroesophageal reflux disease)    History of cesarean delivery 03/27/2021   Peptic ulcer    Pregnancy 07/25/2018   Past Surgical History:  Procedure Laterality Date   APPENDECTOMY  2020   CESAREAN SECTION N/A 07/25/2018   Procedure: CESAREAN SECTION;  Surgeon: Atlas Blank, DO;  [facility]: [facility];  Service: Obstetrics;  Laterality: N/A;   CESAREAN SECTION WITH BILATERAL TUBAL LIGATION N/A 03/27/2021   Procedure: REPEAT CESAREAN SECTION WITH BILATERAL TUBAL LIGATION;  Surgeon: Luan Rumpf, MD;  Location: [facility];  Service: Obstetrics;  Laterality: N/A;   LAPAROSCOPIC APPENDECTOMY N/A 11/07/2018   Procedure:  APPENDECTOMY LAPAROSCOPIC;  Surgeon: Sim Dryer, MD;  Location: [facility];  Service: General;  Laterality: N/A;   TONSILLECTOMY     TUBAL LIGATION  2022   WISDOM TOOTH EXTRACTION     There are no active problems to display for this patient.   PCP: Versa Gore, NP  REFERRING PROVIDER: Versa Gore, NP  REFERRING DIAG: M25.551 (ICD-10-CM) - Pain of right hip R10.31 (ICD-10-CM) - Right groin pain  THERAPY DIAG:  Pain in right hip  Muscle weakness (generalized)  Other low back pain  Rationale for Evaluation and Treatment: Rehabilitation  ONSET DATE: 2022  SUBJECTIVE:   SUBJECTIVE STATEMENT: 10/08/2023 States she has bene doing her exercises and feels like things are getting stronger. States she hasn't been getting the sciatic symptoms and doesn't have the random pains like she was getting. States she still has some tightness.        EVAL: Patient reports she has had ongoing pain for a couple of years.  Reports that it primarily started after the birth of her youngest daughter in 2022.  Reports she had a planned cesarean section which would have been her second however the epidural did not go well and they needed to get a second anesthesiologist in after multiple attempts at the epidural.  Reports that since then she has had hip and back pain.  Reports that her back sometimes feels like a ND straw and  initially after her last childbirth she had this phantom pain in her right hip.  Reports she has gone to multiple doctors who do not seem to be too concerned with it.  Reports she also has seen a chiropractor fairly consistently every 3 to 4 weeks prior to this incident.   States she has not been able to sit crisscross on the floor with her children and getting up and off the floor is very difficult.  Reports she has been afraid to workout intensely because of this pain and wants to improve her overall mobility and function.  Reports she also has symptoms that at times  radiate down the back of the leg all the way to the heel and this pain is typically more burning pain.  Alternatively she has pain in her groin and lateral hip which is more of a pinch she achy pain.  Reports she does not know if these are related at all but really wants to improve her overall function and mobility.  Reports she and her family are going to [facility] in May and it is her overall goal to have less pain and improved mobility by then.  She sees the chiropractor later today    PERTINENT HISTORY: Cesarean delivery x2, appendectomy, migraine PAIN:  Are you having pain? Yes: NPRS scale: 6/10 Pain location: mid back  Pain description: tightness Aggravating factors: sitting on floor, Relieving factors: stretching, cryotherapy   PRECAUTIONS: None  RED FLAGS: None   WEIGHT BEARING RESTRICTIONS: No  FALLS:  Has patient fallen in last 6 months? 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  DIAGNOSTIC FINDINGS: xrays - not on file  PATIENT SURVEYS:  Patient-specific activity functional scoring scheme (Point to one number):  "0" represents "unable to perform." "10" represents "able to perform at prior level. 0 1 2 3 4 5 6 7 8 9  10 (Date and Score) Activity Initial  Activity Eval     Sitting criss cross applesauce 0     Intense Working out  0    bending 4    Additional Additional Total score = sum of the activity scores/number of activities Minimum detectable change (90%CI) for average score = 2 points Minimum detectable change (90%CI) for single activity score = 3 points PSFS developed by: [place] Spitz., & Binkley, J. (1995). Assessing disability and change on individual  patients: a report of a patient specific measure. Physiotherapy [place], 47, [phone]. Reproduced with the permission of the  authors  Score: 4/3= 1.33   COGNITION: Overall cognitive status: Within functional limits for tasks assessed     SENSATION: Not tested     MUSCLE LENGTH: Hamstrings: Right 30* deg; Left 0 deg    POSTURE: decreased lumbar lordosis, increased thoracic kyphosis, and flexed trunk   PALPATION: increased resting tone in right glutes/left lumbar paraspinals, right anterior obliques/groin - pain with increased resting tone areas   Lumbar A/ROM:    EVAL     Flexion 75% limited *     Extension  90% limited*     R ROT  50% limited*     L ROT  50% limited*      R SB      L SB      * Pain   (Blank rows = not tested)  LE Measurements Lower Extremity Right EVAL Left EVAL   A/PROM MMT A/PROM MMT  Hip Flexion WFL 3+ WFL 3+  Hip Extension [facility]  [facility]   Hip Abduction      Hip Adduction      Hip Internal rotation [facility]  [facility]   Hip External rotation [facility]  WFL   Knee Flexion WLF 4 WFL 4  Knee Extension WFL 4+ wFL 4+  Ankle Dorsiflexion  5  5  Ankle Plantarflexion      Ankle Inversion      Ankle Eversion       (Blank rows = not tested) * pain   LOWER EXTREMITY SPECIAL TESTS:  slump test + right, ely's neg B, SLR neg B  FUNCTIONAL TESTS:  Breathing Assessment: anterior breathing with increased belly distension with breath hold / forced exhale                                                                                                                                   TREATMENT DATE:   10/08/2023  Therapeutic Exercise: Review of HEP  Supine: Quad:    prone: belly breathing on pillow 5 minutes  Standing: Neuromuscular Re-education:posterior lateral breathing supine - tolerated well - verbal and tactile cues 10 minutes--> added 8# weight for improved activation Manual Therapy: STM and IASTM with metal tool to lumbar and thoracic paraspinals - tolerated, cupping to thoracic paraspinals well  Therapeutic Activity: Self Care: Trigger Point Dry Needling:  Modalities:  thermo   PATIENT EDUCATION:  Education details: on HEP Person educated: Patient Education method: Programmer, multimedia, Demonstration, and Handouts Education comprehension: verbalized understanding   HOME EXERCISE PROGRAM: [id number] Long exhale breathing with zipper  Posterior lateral breathing with weight reaching towards the ceiling.   ASSESSMENT:  CLINICAL IMPRESSION: 10/08/2023 Session focused on review of HEP and progression of exercises. Started with heat and manual which was tolerated well. Improved rib mobility noted afterwards and added additional breathing exercises to program. Reduced tightness and tension noted end of session, will continue with current POC as tolerated.   EVAL: Patient presents to physical therapy with complaints of chronic right hip and low back pain that started after delivery of youngest child in 2022.  Symptoms present with 3 areas of pain including her low back, right lateral hip, posterior leg symptoms down to her heel, and right anterior groin.  Patient presents with limitations in range of motion, strength and overall postural limitations that are greatly contributing to current presentation.  Session focused on education as well as establishing initial home exercise program to address the findings found in examination.  Patient limited in ability to take care of of her children at home and would greatly benefit from skilled PT to address physical limitations and improve overall function and quality of life.  OBJECTIVE IMPAIRMENTS: decreased activity tolerance, difficulty walking, decreased ROM, decreased strength, improper body mechanics, postural dysfunction, and pain.   ACTIVITY LIMITATIONS:  lifting, bending, sitting, and locomotion level  PARTICIPATION LIMITATIONS: cleaning and community activity  PERSONAL FACTORS: Fitness, Time since onset of injury/illness/exacerbation, and 1 comorbidity: x2 cesarean deliervies are also affecting patient's functional  outcome.   REHAB POTENTIAL: Good  CLINICAL DECISION MAKING: Stable/uncomplicated  EVALUATION COMPLEXITY: Low   GOALS: Goals reviewed with patient? yes  SHORT TERM GOALS: Target date: 10/30/2023  Patient will be independent in self management strategies to improve quality of life and functional outcomes. Baseline: New Program Goal status: INITIAL  2.  Patient will report at least 50% improvement in overall symptoms and/or function to demonstrate improved functional mobility Baseline: 0% better Goal status: INITIAL  3.  Patient will be able to demonstrate long exhale without belly distention to demonstrate improved activation of core musculature with breathing Baseline: Unable Goal status: INITIAL    LONG TERM GOALS: Target date: 12/11/2023   Patient will report at least 75% improvement in overall symptoms and/or function to demonstrate improved functional mobility Baseline: 0% better Goal status: INITIAL  2.  Patient will score at least 2 points higher on PSFS average to demonstrate change in overall function. Baseline: see above Goal status: INITIAL  3.  Patient will demonstrate pain-free lumbar range of motion in all directions Baseline: Painful Goal status: INITIAL  4.  Patient will be able to bend with good mechanics to reduce stress on pelvis and low back Baseline: Unable painful Goal status: INITIAL    PLAN:  PT FREQUENCY: 1-2x/week for total of 16 visits over 12 weeks certification.  PT DURATION: 12 weeks  PLANNED INTERVENTIONS: 97110-Therapeutic exercises, 97530- Therapeutic activity, [id number]- Neuromuscular re-education, [phone]- Self Care, 60454- Manual therapy, [phone]- Gait training, [phone]- Orthotic Fit/training, [phone]- Canalith repositioning, [id number]- Aquatic Therapy, 97014- Electrical stimulation (unattended), [phone]- Ionotophoresis 4mg /ml Dexamethasone , Patient/Family education, Balance training, Stair training, Taping, Dry Needling, Joint mobilization, Joint  manipulation, Spinal manipulation, Spinal mobilization, Cryotherapy, and Moist heat   PLAN FOR NEXT SESSION: sit bone education, posture,  breathing, STM to right pelvis and left low back - try percussion, isometrics, breathing exercises, quadruped   1:45 PM, 10/08/23 [name], DPT Physical Therapy with [place]

## 2023-10-24 ENCOUNTER — Encounter: Admitting: Physical Therapy

## 2024-01-28 ENCOUNTER — Encounter: Payer: Self-pay | Admitting: Family

## 2024-02-03 ENCOUNTER — Encounter: Payer: Self-pay | Admitting: Family

## 2024-02-03 ENCOUNTER — Ambulatory Visit: Admitting: Family

## 2024-02-03 VITALS — BP 114/78 | HR 74 | Temp 98.1°F | Ht 67.0 in | Wt 185.5 lb

## 2024-02-03 DIAGNOSIS — Z1339 Encounter for screening examination for other mental health and behavioral disorders: Secondary | ICD-10-CM | POA: Diagnosis not present

## 2024-02-03 MED ORDER — LISDEXAMFETAMINE DIMESYLATE 20 MG PO CAPS: 20.0000 mg | ORAL_CAPSULE | Freq: Every day | ORAL | 0 refills | Status: DC

## 2024-02-03 NOTE — Patient Instructions (Signed)
 It was very nice to see you today!   I will review your lab results via MyChart in a few days.   Have a great rest of the week!   PLEASE NOTE:  If you had any lab tests please let us know if you have not heard back within a few days. You may see your results on MyChart before we have a chance to review them but we will give you a call once they are reviewed by [place]. If we ordered any referrals today, please let us know if you have not heard from their office within the next week.

## 2024-02-03 NOTE — Progress Notes (Signed)
 "  Patient ID: [name], female    DOB: [birth year], 38 y.o.   MRN: [id number]  Chief Complaint  Patient presents with   ADHD  Discussed the use of AI scribe software for clinical note transcription with the patient, who gave verbal consent to proceed.  History of Present Illness Nimrit Kehres Whitehurst is a 38 year old female who presents with symptoms suggestive of OCD and ADHD.  Obsessive-compulsive symptoms - Symptoms suggestive of severe obsessive-compulsive disorder, as indicated by her current therapist - Experiences 'analysis paralysis' leading to difficulty making decisions and completing tasks - Many unfinished projects due to compulsive overthinking - Working on medical sales representative, utilizing a occupational hygienist to manage symptoms  Inattention and executive dysfunction - Symptoms possibly present since childhood, consistent with undiagnosed attention-deficit/hyperactivity disorder - Difficulty initiating and completing tasks - Relies on organizational strategies to compensate for executive dysfunction  Psychosocial impact - Concerned about the impact of her symptoms on her children, particularly her five-and-a-half-year-old daughter who exhibits similar childhood behaviors  Therapeutic interventions and alternative treatments - In therapy since 2017, with a change in therapist last year - Utilizes holistic treatments, including CBD supplements - Interested in natural supplements  Assessment & Plan Attention-deficit hyperactivity disorder (ADHD) ADHD symptoms persistent since childhood, managed with holistic approaches. Therapist suggests possible severe OCD related to ADHD. Open to medication to prevent negative impact on children. Discussed trying herbal ginko biloba.  - Prescribe lisdexamfetamine  20 mg daily in the morning with breakfast. - Discuss potential side effects: appetite suppression, increased heart rate, jitteriness, anxiety. - Advise lisdexamfetamine  is a controlled  substance; discuss secure storage and implications of loss/theft. - Sign controlled substance contract acknowledging controlled status, agreeing not to sell or abuse. - Schedule follow-up in 1 month to assess medication effectiveness and check vital signs, including blood pressure.   Subjective:    Outpatient Medications Prior to Visit  Medication Sig Dispense Refill   tretinoin (RETIN-A) 0.05 % cream Apply topically at bedtime.     No facility-administered medications prior to visit.   Past Medical History:  Diagnosis Date   Acute appendicitis 11/07/2018   Anemia    Anxiety    sees a therapist   Appendicitis 11/07/2018   GERD (gastroesophageal reflux disease)    History of cesarean delivery 03/27/2021   Peptic ulcer    Pregnancy 07/25/2018   Past Surgical History:  Procedure Laterality Date   APPENDECTOMY  2020   CESAREAN SECTION N/A 07/25/2018   Procedure: CESAREAN SECTION;  Surgeon: Lilton Legions, DO;  [facility]: [facility];  Service: Obstetrics;  Laterality: N/A;   CESAREAN SECTION WITH BILATERAL TUBAL LIGATION N/A 03/27/2021   Procedure: REPEAT CESAREAN SECTION WITH BILATERAL TUBAL LIGATION;  Surgeon: Gretta Gums, MD;  [facility]: [facility];  Service: Obstetrics;  Laterality: N/A;   LAPAROSCOPIC APPENDECTOMY N/A 11/07/2018   Procedure: APPENDECTOMY LAPAROSCOPIC;  Surgeon: Vanderbilt Ned, MD;  Location: [facility];  Service: General;  Laterality: N/A;   TONSILLECTOMY     TUBAL LIGATION  2022   WISDOM TOOTH EXTRACTION     Allergies  Allergen Reactions   Egg Solids, Whole Other (See Comments)    Bloating, abdominal swelling.       Objective:    Physical Exam Vitals and nursing note reviewed.  Constitutional:      Appearance: Normal appearance.  Cardiovascular:     Rate and Rhythm: Normal rate and regular rhythm.  Pulmonary:     Effort: Pulmonary effort is normal.  Breath sounds: Normal breath sounds.  Musculoskeletal:        General: Normal range of  motion.  Skin:    General: Skin is warm and dry.  Neurological:     Mental Status: She is alert.  Psychiatric:        Mood and Affect: Mood normal.        Behavior: Behavior normal.    BP 114/78 (BP Location: Left Arm, Patient Position: Sitting, Cuff Size: Large)   Pulse 74   Temp 98.1 F (36.7 C) (Temporal)   Ht 5' 7 (1.702 m)   Wt 185 lb 8 oz (84.1 kg)   LMP 01/19/2024 (Exact Date)   SpO2 95%   BMI 29.05 kg/m  Wt Readings from Last 3 Encounters:  02/03/24 185 lb 8 oz (84.1 kg)  09/27/23 177 lb 11.2 oz (80.6 kg)  09/09/23 179 lb 12.8 oz (81.6 kg)      Lucius Krabbe, NP  "

## 2024-02-04 DIAGNOSIS — Z1339 Encounter for screening examination for other mental health and behavioral disorders: Secondary | ICD-10-CM | POA: Insufficient documentation

## 2024-02-04 NOTE — Assessment & Plan Note (Signed)
 ADHD symptoms persistent since childhood, managed with holistic approaches. Therapist suggests possible severe OCD related to ADHD. Open to medication to prevent negative impact on children. Discussed trying herbal ginko biloba.  - Prescribe lisdexamfetamine 20 mg daily in the morning with breakfast. - Discuss potential side effects: appetite suppression, increased heart rate, jitteriness, anxiety. - Advise lisdexamfetamine is a controlled substance; discuss secure storage and implications of loss/theft. - Sign controlled substance contract acknowledging controlled status, agreeing not to sell or abuse. - Schedule follow-up in 1 month to assess medication effectiveness and check vital signs, including blood pressure.

## 2024-03-03 ENCOUNTER — Encounter: Payer: Self-pay | Admitting: Family

## 2024-03-03 ENCOUNTER — Ambulatory Visit: Admitting: Family

## 2024-03-03 VITALS — BP 108/76 | HR 79 | Temp 98.1°F | Ht 67.0 in | Wt 183.0 lb

## 2024-03-03 DIAGNOSIS — F909 Attention-deficit hyperactivity disorder, unspecified type: Secondary | ICD-10-CM | POA: Diagnosis not present

## 2024-03-03 DIAGNOSIS — Z1339 Encounter for screening examination for other mental health and behavioral disorders: Secondary | ICD-10-CM

## 2024-03-03 MED ORDER — LISDEXAMFETAMINE DIMESYLATE 30 MG PO CAPS: 30.0000 mg | ORAL_CAPSULE | Freq: Every day | ORAL | 0 refills | Status: DC

## 2024-03-03 NOTE — Assessment & Plan Note (Signed)
 Current medication effective but may require dose adjustment d/t not at goal for symptoms. No reported side effects. - Increasing Vyvanse  dose to 30mg  qam, sending 30d refills x 3mos. - Continue taking medication with food to minimize nausea. - Monitor for side effects, particularly increased anxiety or sleep disturbances. - Advise careful caffeine management to avoid exacerbating anxiety.  - F/U in 3 mos, ok if virtual, can notify sooner if need another dose adjustment prior to 3 mos.

## 2024-03-03 NOTE — Progress Notes (Signed)
 Patient ID: [name], female    DOB: Mar 17, 1986, 38 y.o.   MRN: [id number]  Chief Complaint  Patient presents with   ADHD  Discussed the use of AI scribe software for clinical note transcription with the patient, who gave verbal consent to proceed.  History of Present Illness   [name] is a 38 year old female who presents with concerns about the effectiveness of her current medication regimen for ADHD.  Attention deficit symptoms and medication efficacy - Currently taking Vyvanse  20mg  qam, started last 1 month ago. - Initial improvement in mental clarity after starting medication. - Medication effects diminish by 3 or 4 PM. - Takes medication in the morning with food to avoid nausea. - No anxiety, jitteriness, or sleep disturbances associated with medication. - Experiences persistent 'food noise' in her thoughts, which was initially reduced by medication. - Continues to eat regularly, including breakfast, without significant changes in appetite. - Limits caffeine intake to between 7 and 8 AM to avoid exacerbating anxiety.  Upper respiratory symptoms and fatigue - For the past week and a half, experiences symptoms consistent with a cold. - Exceptional tiredness and brain fog present, attributed to fighting off an illness.  - Reports a lump on back of upper neck    Assessment & Plan:     ADHD Current medication effective but may require dose adjustment d/t not at goal for symptoms. No reported side effects. - Increasing Vyvanse  dose to 30mg  qam, sending 30d refills x 3mos. - Continue taking medication with food to minimize nausea. - Monitor for side effects, particularly increased anxiety or sleep disturbances. - Advise careful caffeine management to avoid exacerbating anxiety.  - F/U in 3 mos, ok if virtual, can notify sooner if need another dose adjustment prior to 3 mos.  Neck lump Feels like possible swollen occipital lymph node, approx. 1.5cm in diameter,  fixed. No visible scratch of trauma to surrounding skin. Reports recent viral infection.  - Recommend monitoring. Notify office if increases in size, becomes tender, or is not resolving after another 2-3 weeks.    Subjective:    Outpatient Medications Prior to Visit  Medication Sig Dispense Refill   lisdexamfetamine (VYVANSE ) 20 MG capsule Take 1 capsule (20 mg total) by mouth daily. 30 capsule 0   tretinoin (RETIN-A) 0.05 % cream Apply topically at bedtime.     No facility-administered medications prior to visit.   Past Medical History:  Diagnosis Date   Acute appendicitis 11/07/2018   Anemia    Anxiety    sees a therapist   Appendicitis 11/07/2018   GERD (gastroesophageal reflux disease)    History of cesarean delivery 03/27/2021   Peptic ulcer    Pregnancy 07/25/2018   Past Surgical History:  Procedure Laterality Date   APPENDECTOMY  2020   CESAREAN SECTION N/A 07/25/2018   Procedure: CESAREAN SECTION;  Surgeon: Lilton Legions, DO;  [facility];  Service: Obstetrics;  Laterality: N/A;   CESAREAN SECTION WITH BILATERAL TUBAL LIGATION N/A 03/27/2021   Procedure: REPEAT CESAREAN SECTION WITH BILATERAL TUBAL LIGATION;  Surgeon: Gretta Gums, MD;  [facility]: [facility];  Service: Obstetrics;  Laterality: N/A;   LAPAROSCOPIC APPENDECTOMY N/A 11/07/2018   Procedure: APPENDECTOMY LAPAROSCOPIC;  Surgeon: Vanderbilt Ned, MD;  Location: [facility];  Service: General;  Laterality: N/A;   TONSILLECTOMY     TUBAL LIGATION  2022   WISDOM TOOTH EXTRACTION     Allergies  Allergen Reactions   Egg Solids, Whole  Other (See Comments)    Bloating, abdominal swelling.       Objective:    Physical Exam Vitals and nursing note reviewed.  Constitutional:      Appearance: Normal appearance.  Cardiovascular:     Rate and Rhythm: Normal rate and regular rhythm.  Pulmonary:     Effort: Pulmonary effort is normal.     Breath sounds: Normal breath sounds.  Musculoskeletal:         General: Normal range of motion.  Lymphadenopathy:     Head:     Right side of head: Occipital adenopathy present. No preauricular or posterior auricular adenopathy.     Left side of head: No preauricular, posterior auricular or occipital adenopathy.  Skin:    General: Skin is warm and dry.  Neurological:     Mental Status: She is alert.  Psychiatric:        Mood and Affect: Mood normal.        Behavior: Behavior normal.    BP 108/76 (BP Location: Left Arm, Patient Position: Sitting, Cuff Size: Large)   Pulse 79   Temp 98.1 F (36.7 C) (Temporal)   Ht 5' 7 (1.702 m)   Wt 183 lb (83 kg)   LMP 02/16/2024 (Exact Date)   SpO2 97%   BMI 28.66 kg/m  Wt Readings from Last 3 Encounters:  03/03/24 183 lb (83 kg)  02/03/24 185 lb 8 oz (84.1 kg)  09/27/23 177 lb 11.2 oz (80.6 kg)      Lucius Krabbe, NP

## 2024-06-01 ENCOUNTER — Encounter: Payer: Self-pay | Admitting: Family

## 2024-06-01 ENCOUNTER — Telehealth: Admitting: Family

## 2024-06-01 DIAGNOSIS — F900 Attention-deficit hyperactivity disorder, predominantly inattentive type: Secondary | ICD-10-CM | POA: Diagnosis not present

## 2024-06-01 MED ORDER — LISDEXAMFETAMINE DIMESYLATE 30 MG PO CAPS: 30.0000 mg | ORAL_CAPSULE | Freq: Every day | ORAL | 0 refills | Status: AC

## 2024-06-01 NOTE — Progress Notes (Signed)
 MyChart Video Visit    Virtual Visit via Video Note   This format is felt to be most appropriate for this patient at this time. Physical exam was limited by quality of the video and audio technology used for the visit. CMA was able to get the patient set up on a video visit.  Patient location: Home. Patient and provider in visit Provider location: Office  I discussed the limitations of evaluation and management by telemedicine and the availability of in person appointments. The patient expressed understanding and agreed to proceed.  Visit Date: 06/01/2024  Today's healthcare provider: Lucius Krabbe, NP     Subjective:   Patient ID: [name], female    DOB: [birth year], 38 y.o.   MRN: [id number]  Chief Complaint  Patient presents with   ADHD    Follow up   ADHD f/u: Medications helping target goals: Vyvanse  30mg  qam Regimen: daily Medication side effects/concerns:  none. Weight: no change Sleep: no concerns Mood changes: none.  Tics: denies Blood pressure, Weight, Pulse, Behavior reviewed: reports all wnl.  Assessment & Plan 1. ADHD (attention deficit hyperactivity disorder), inattentive type (Primary) Dose increased last visit to 30mg , reports doing much better, denies any SE.   - lisdexamfetamine (VYVANSE ) 30 MG capsule; Take 1 capsule (30 mg total) by mouth daily before breakfast.  Dispense: 30 capsule; Refill: 0 - lisdexamfetamine (VYVANSE ) 30 MG capsule; Take 1 capsule (30 mg total) by mouth daily before breakfast.  Dispense: 30 capsule; Refill: 0 - lisdexamfetamine (VYVANSE ) 30 MG capsule; Take 1 capsule (30 mg total) by mouth daily before breakfast.  Dispense: 30 capsule; Refill: 0   Past Medical History:  Diagnosis Date   Acute appendicitis 11/07/2018   Anemia    Anxiety    sees a therapist   Appendicitis 11/07/2018   GERD (gastroesophageal reflux disease)    History of cesarean delivery 03/27/2021   Peptic ulcer    Pregnancy 07/25/2018     Past Surgical History:  Procedure Laterality Date   APPENDECTOMY  2020   CESAREAN SECTION N/A 07/25/2018   Procedure: CESAREAN SECTION;  Surgeon: Lilton Legions, DO;  Location: [facility];  Service: Obstetrics;  Laterality: N/A;   CESAREAN SECTION WITH BILATERAL TUBAL LIGATION N/A 03/27/2021   Procedure: REPEAT CESAREAN SECTION WITH BILATERAL TUBAL LIGATION;  Surgeon: Gretta Gums, MD;  Location: [facility];  Service: Obstetrics;  Laterality: N/A;   LAPAROSCOPIC APPENDECTOMY N/A 11/07/2018   Procedure: APPENDECTOMY LAPAROSCOPIC;  Surgeon: Vanderbilt Ned, MD;  Location: [facility];  Service: General;  Laterality: N/A;   TONSILLECTOMY     TUBAL LIGATION  2022   WISDOM TOOTH EXTRACTION      Outpatient Medications Prior to Visit  Medication Sig Dispense Refill   lisdexamfetamine (VYVANSE ) 30 MG capsule Take 1 capsule (30 mg total) by mouth daily before breakfast. 30 capsule 0   lisdexamfetamine (VYVANSE ) 30 MG capsule Take 1 capsule (30 mg total) by mouth daily before breakfast. 30 capsule 0   lisdexamfetamine (VYVANSE ) 30 MG capsule Take 1 capsule (30 mg total) by mouth daily before breakfast. 30 capsule 0   tretinoin (RETIN-A) 0.05 % cream Apply topically at bedtime.     No facility-administered medications prior to visit.    Allergies  Allergen Reactions   Egg Solids, Whole Other (See Comments)    Bloating, abdominal swelling.        Objective:   Physical Exam Vitals and nursing note reviewed.  Constitutional:  General: Pt is not in acute distress.    Appearance: Normal appearance.  HENT:     Head: Normocephalic.  Pulmonary:     Effort: No respiratory distress.  Musculoskeletal:     Cervical back: Normal range of motion.  Skin:    General: Skin is dry.     Coloration: Skin is not pale.  Neurological:     Mental Status: Pt is alert and oriented to person, place, and time.  Psychiatric:        Mood and Affect: Mood normal.   There were no vitals taken  for this visit.  Wt Readings from Last 3 Encounters:  03/03/24 183 lb (83 kg)  02/03/24 185 lb 8 oz (84.1 kg)  09/27/23 177 lb 11.2 oz (80.6 kg)     I discussed the assessment and treatment plan with the patient. The patient was provided an opportunity to ask questions and all were answered. The patient agreed with the plan and demonstrated an understanding of the instructions.   The patient was advised to call back or seek an in-person evaluation if the symptoms worsen or if the condition fails to improve as anticipated.  Lucius Krabbe, NP [facility] [phone] (phone) [phone] (fax)  [facility]

## 2024-08-31 ENCOUNTER — Ambulatory Visit: Admitting: Family

## 2024-09-29 ENCOUNTER — Encounter: Admitting: Family
